# Patient Record
Sex: Male | Born: 1990 | Race: Black or African American | Hispanic: No | Marital: Married | Smoking: Current every day smoker
Health system: Southern US, Community
[De-identification: ages and names within clinical notes are randomized; demographics above are authoritative.]

## PROBLEM LIST (undated history)

## (undated) DIAGNOSIS — J45909 Unspecified asthma, uncomplicated: Secondary | ICD-10-CM

## (undated) HISTORY — PX: LEG SURGERY: SHX1003

---
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
## 2011-08-04 IMAGING — CR DG CHEST 2V
2 series · 2 of 2 positions shown · non-contrast
Comparison: 11/16/2009

CLINICAL DATA: Chest pain

CHEST - 2 VIEW

[w chest pa]
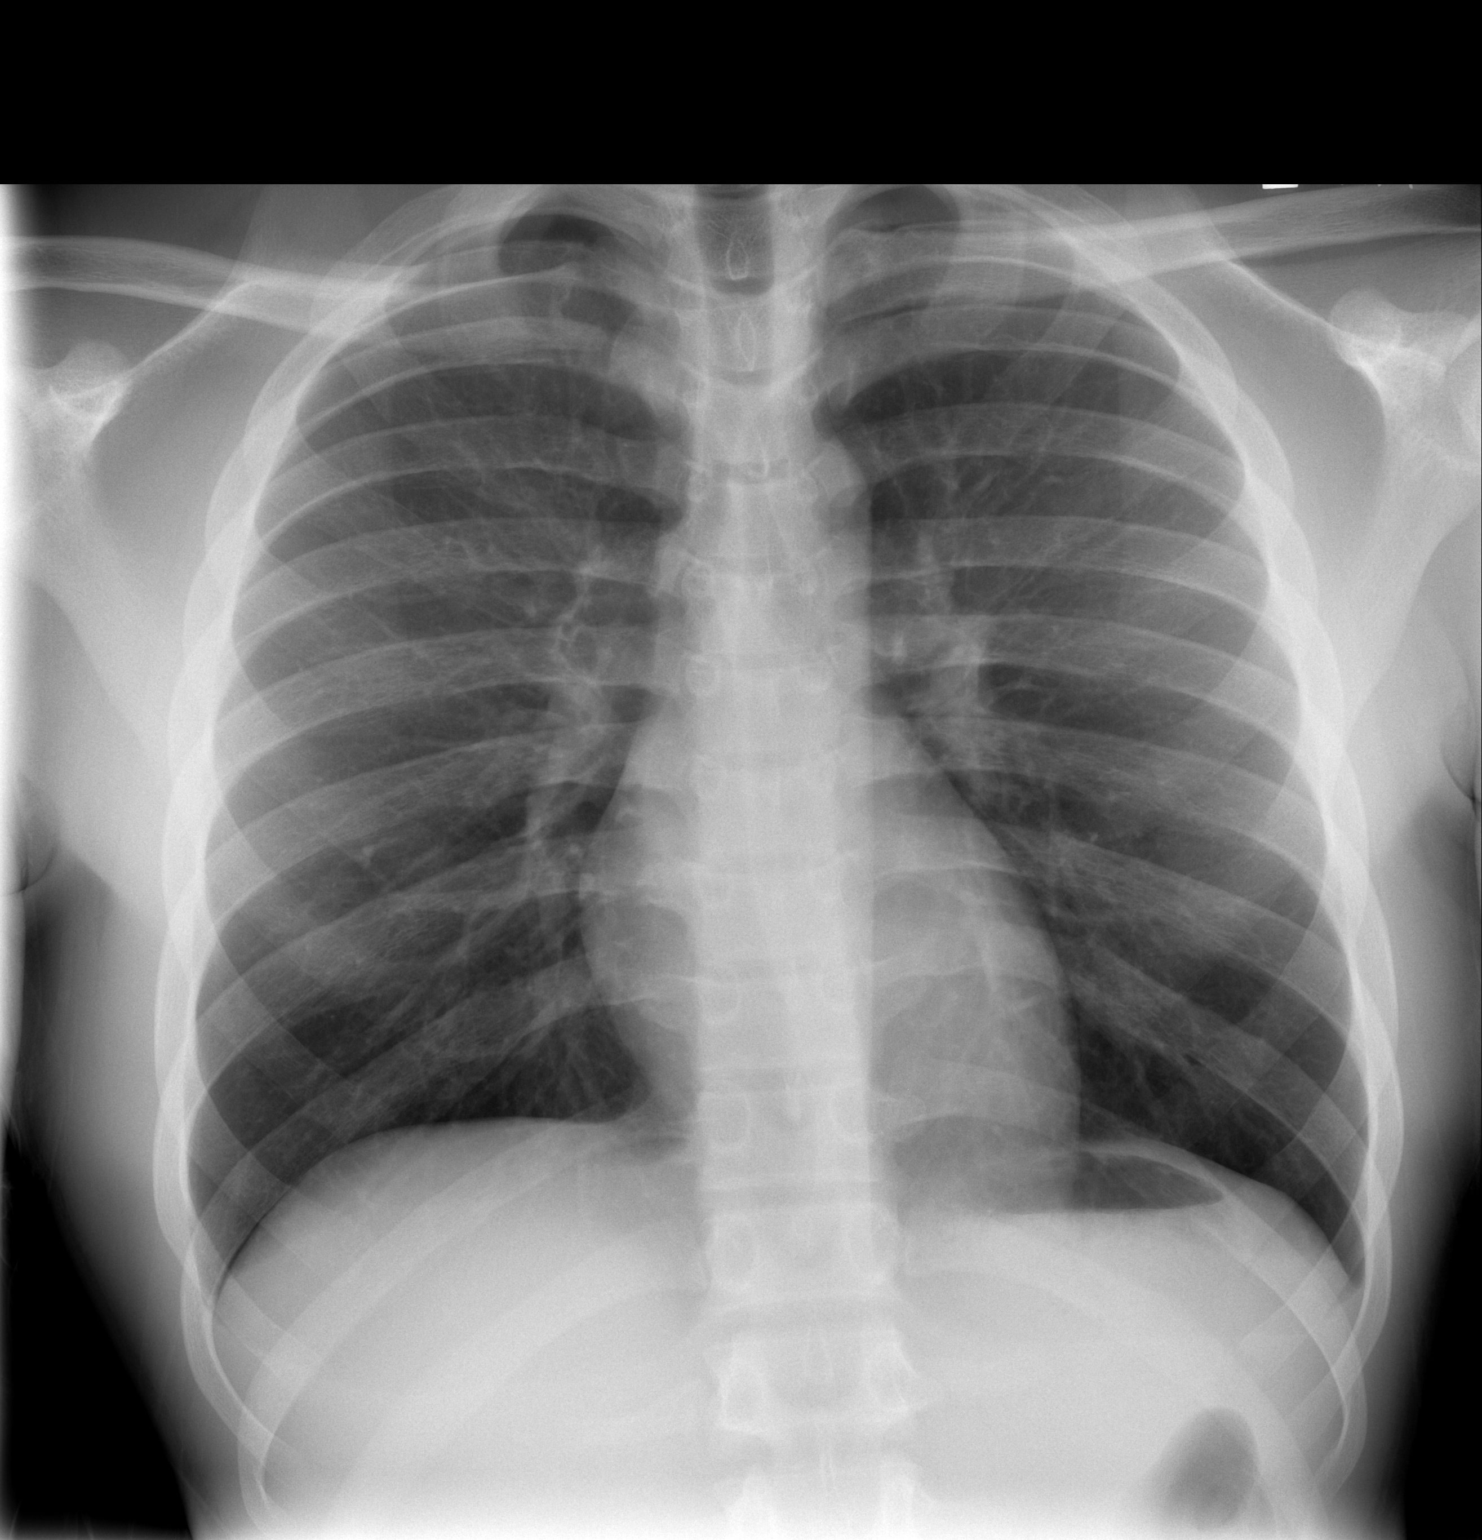

[w chest lat]
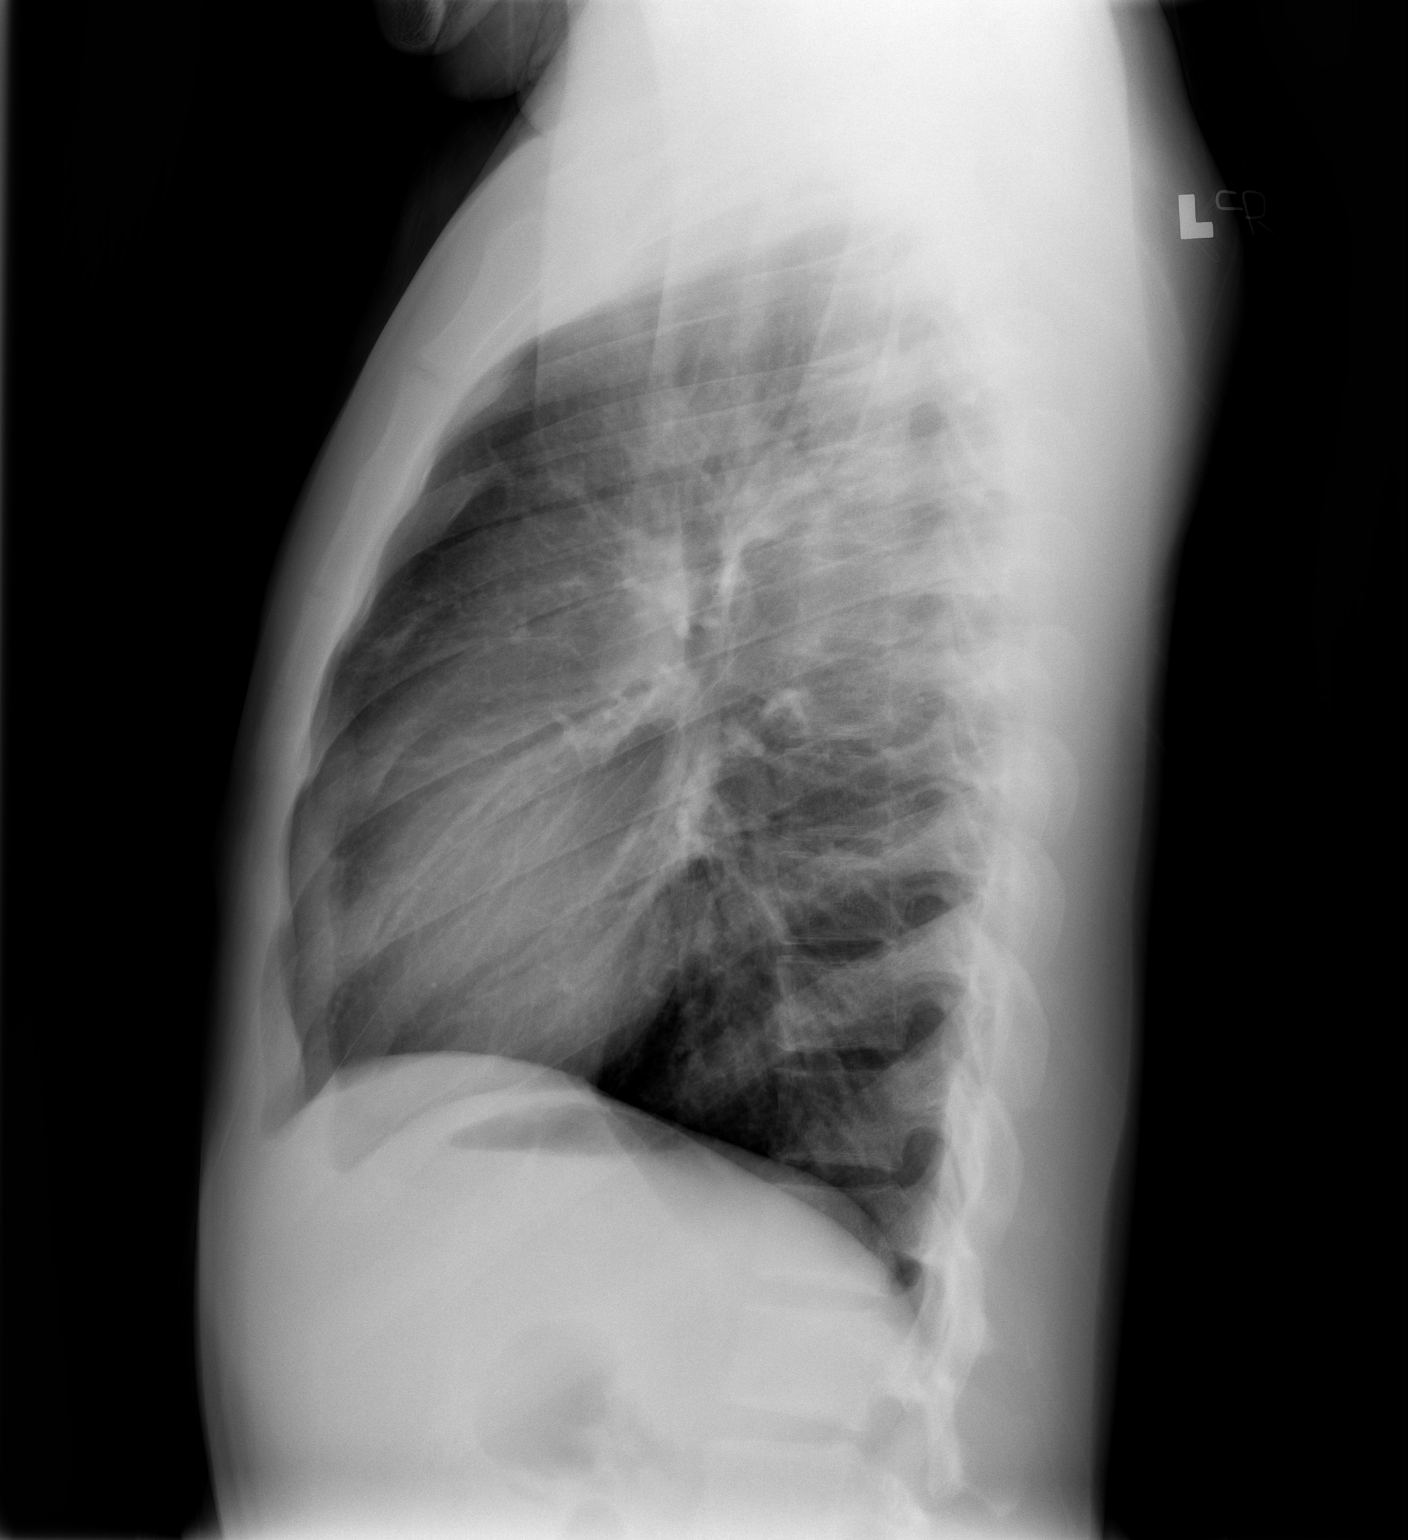

[2 of 2 positions shown; findings below may reference images not displayed]

FINDINGS: Normal heart size, mediastinal contours, and pulmonary vascularity.
Minimal peribronchial thickening.
Lungs otherwise clear.
No pleural effusion or pneumothorax.
Minimal pulmonary hyperexpansion.
Osseous structures unremarkable.
IMPRESSION: Peribronchial thickening and minimally hyperexpanded lungs, which
could reflect bronchitis or reactive airway disease.
No acute infiltrate.

## 2014-11-08 ENCOUNTER — Emergency Department (HOSPITAL_COMMUNITY): Payer: Medicaid Other

## 2014-11-08 ENCOUNTER — Encounter (HOSPITAL_COMMUNITY): Payer: Self-pay

## 2014-11-08 ENCOUNTER — Emergency Department (HOSPITAL_COMMUNITY)
Admission: EM | Admit: 2014-11-08 | Discharge: 2014-11-08 | Disposition: A | Discharge status: Home / Self Care | Payer: Medicaid Other | Attending: Emergency Medicine | Admitting: Emergency Medicine

## 2014-11-08 DIAGNOSIS — Y9289 Other specified places as the place of occurrence of the external cause: Secondary | ICD-10-CM | POA: Insufficient documentation

## 2014-11-08 DIAGNOSIS — Y998 Other external cause status: Secondary | ICD-10-CM | POA: Insufficient documentation

## 2014-11-08 DIAGNOSIS — Z720 Tobacco use: Secondary | ICD-10-CM | POA: Insufficient documentation

## 2014-11-08 DIAGNOSIS — Y9389 Activity, other specified: Secondary | ICD-10-CM | POA: Insufficient documentation

## 2014-11-08 DIAGNOSIS — S60221A Contusion of right hand, initial encounter: Secondary | ICD-10-CM | POA: Insufficient documentation

## 2014-11-08 MED ORDER — IBUPROFEN 600 MG PO TABS: 600.0000 mg | ORAL_TABLET | Freq: Four times a day (QID) | ORAL | Status: DC | PRN
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
## 2014-11-08 NOTE — ED Notes (Signed)
Pt declining pain medication at this time.

## 2014-11-08 NOTE — ED Notes (Signed)
Pt given ice for hand.  

## 2014-11-08 NOTE — Discharge Instructions (Signed)
Please take the motrin every 6 hours to keep the swelling down. Use RICE treatment as below. No fracture seen on the Xays, but you have pain by the base of the thumb and so seeing hand doctor is mandatory.   Contusion A contusion is a deep bruise. Contusions are the result of an injury that caused bleeding under the skin. The contusion may turn blue, purple, or yellow. Minor injuries will give you a painless contusion, but more severe contusions may stay painful and swollen for a few weeks.  CAUSES  A contusion is usually caused by a blow, trauma, or direct force to an area of the body. SYMPTOMS   Swelling and redness of the injured area.  Bruising of the injured area.  Tenderness and soreness of the injured area.  Pain. DIAGNOSIS  The diagnosis can be made by taking a history and physical exam. An X-ray, CT scan, or MRI may be needed to determine if there were any associated injuries, such as fractures. TREATMENT  Specific treatment will depend on what area of the body was injured. In general, the best treatment for a contusion is resting, icing, elevating, and applying cold compresses to the injured area. Over-the-counter medicines may also be recommended for pain control. Ask your caregiver what the best treatment is for your contusion. HOME CARE INSTRUCTIONS   Put ice on the injured area.  Put ice in a plastic bag.  Place a towel between your skin and the bag.  Leave the ice on for 15-20 minutes, 3-4 times a day, or as directed by your health care provider.  Only take over-the-counter or prescription medicines for pain, discomfort, or fever as directed by your caregiver. Your caregiver may recommend avoiding anti-inflammatory medicines (aspirin, ibuprofen, and naproxen) for 48 hours because these medicines may increase bruising.  Rest the injured area.  If possible, elevate the injured area to reduce swelling. SEEK IMMEDIATE MEDICAL CARE IF:   You have increased bruising or  swelling.  You have pain that is getting worse.  Your swelling or pain is not relieved with medicines. MAKE SURE YOU:   Understand these instructions.  Will watch your condition.  Will get help right away if you are not doing well or get worse. Document Released: 01/14/2005 Document Revised: 04/11/2013 Document Reviewed: 02/09/2011 [facility] Patient Information 2015 [place], [place]. This information is not intended to replace advice given to you by your health care provider. Make sure you discuss any questions you have with your health care provider. RICE: Routine Care for Injuries The routine care of many injuries includes Rest, Ice, Compression, and Elevation (RICE). HOME CARE INSTRUCTIONS  Rest is needed to allow your body to heal. Routine activities can usually be resumed when comfortable. Injured tendons and bones can take up to 6 weeks to heal. Tendons are the cord-like structures that attach muscle to bone.  Ice following an injury helps keep the swelling down and reduces pain.  Put ice in a plastic bag.  Place a towel between your skin and the bag.  Leave the ice on for 15-20 minutes, 3-4 times a day, or as directed by your health care provider. Do this while awake, for the first 24 to 48 hours. After that, continue as directed by your caregiver.  Compression helps keep swelling down. It also gives support and helps with discomfort. If an elastic bandage has been applied, it should be removed and reapplied every 3 to 4 hours. It should not be applied tightly, but firmly  enough to keep swelling down. Watch fingers or toes for swelling, bluish discoloration, coldness, numbness, or excessive pain. If any of these problems occur, remove the bandage and reapply loosely. Contact your caregiver if these problems continue.  Elevation helps reduce swelling and decreases pain. With extremities, such as the arms, hands, legs, and feet, the injured area should be placed near or above the  level of the heart, if possible. SEEK IMMEDIATE MEDICAL CARE IF:  You have persistent pain and swelling.  You develop redness, numbness, or unexpected weakness.  Your symptoms are getting worse rather than improving after several days. These symptoms may indicate that further evaluation or further X-rays are needed. Sometimes, X-rays may not show a small broken bone (fracture) until 1 week or 10 days later. Make a follow-up appointment with your caregiver. Ask when your X-ray results will be ready. Make sure you get your X-ray results. Document Released: 07/19/2000 Document Revised: 04/11/2013 Document Reviewed: 09/05/2010 [facility] Patient Information 2015 [place], [place]. This information is not intended to replace advice given to you by your health care provider. Make sure you discuss any questions you have with your health care provider. Scaphoid Fracture, Wrist A fracture is a break in the bone. The bone you have broken often does not show up as a fracture on x-ray until later on in the healing phase. This bone is called the scaphoid bone. With this bone, your caregiver will often cast or splint your wrist as though it is fractured, even if a fracture is not seen on the x-ray. This is often done with wrist injuries in which there is tenderness at the base of the thumb. An x-ray at 1-3 weeks after your injury may confirm this fracture. A cast or splint is used to protect and keep your injured bone in good position for healing. The cast or splint will be on generally for about 6 to 16 weeks, depending on your health, age, the fracture location and how quickly you heal. Another name for the scaphoid bone is the navicular bone. HOME CARE INSTRUCTIONS   To lessen the swelling and pain, keep the injured part elevated above your heart while sitting or lying down.  Apply ice to the injury for 15-20 minutes, 03-04 times per day while awake, for 2 days. Put the ice in a plastic bag and place a thin towel  between the bag of ice and your cast.  If you have a plaster or fiberglass cast or splint:  Do not try to scratch the skin under the cast using sharp or pointed objects.  Check the skin around the cast every day. You may put lotion on any red or sore areas.  Keep your cast or splint dry and clean.  If you have a plaster splint:  Wear the splint as directed.  You may loosen the elastic bandage around the splint if your fingers become numb, tingle, or turn cold or blue.  If you have been put in a removable splint, wear and use as directed.  Do not put pressure on any part of your cast or splint; it may deform or break. Rest your cast or splint only on a pillow the first 24 hours until it is fully hardened.  Your cast or splint can be protected during bathing with a plastic bag. Do not lower the cast or splint into water.  Only take over-the-counter or prescription medicines for pain, discomfort, or fever as directed by your caregiver.  If your caregiver has given  you a follow up appointment, it is very important to keep that appointment. Not keeping the appointment could result in chronic pain and decreased function. If there is any problem keeping the appointment, you must call back to this facility for assistance. SEEK IMMEDIATE MEDICAL CARE IF:   Your cast gets damaged, wet or breaks.  You have continued severe pain or more swelling than you did before the cast or splint was put on.  Your skin or nails below the injury turn blue or gray, or feel cold or numb.  You have tingling or burning pain in your fingers or increasing pain with movement of your fingers Document Released: 03/27/2002 Document Revised: 06/29/2011 Document Reviewed: 11/23/2008 [facility] Patient Information 2015 [place], [place]. This information is not intended to replace advice given to you by your health care provider. Make sure you discuss any questions you have with your health care provider.

## 2014-12-20 ENCOUNTER — Emergency Department (HOSPITAL_COMMUNITY): Payer: No Typology Code available for payment source

## 2014-12-20 ENCOUNTER — Encounter (HOSPITAL_COMMUNITY): Payer: Self-pay | Admitting: *Deleted

## 2014-12-20 ENCOUNTER — Emergency Department (HOSPITAL_COMMUNITY)
Admission: EM | Admit: 2014-12-20 | Discharge: 2014-12-20 | Disposition: A | Discharge status: Home / Self Care | Payer: No Typology Code available for payment source | Attending: Emergency Medicine | Admitting: Emergency Medicine

## 2014-12-20 DIAGNOSIS — Y92410 Unspecified street and highway as the place of occurrence of the external cause: Secondary | ICD-10-CM | POA: Insufficient documentation

## 2014-12-20 DIAGNOSIS — S80219A Abrasion, unspecified knee, initial encounter: Secondary | ICD-10-CM | POA: Diagnosis not present

## 2014-12-20 DIAGNOSIS — S70219A Abrasion, unspecified hip, initial encounter: Secondary | ICD-10-CM | POA: Insufficient documentation

## 2014-12-20 DIAGNOSIS — S0081XA Abrasion of other part of head, initial encounter: Secondary | ICD-10-CM | POA: Insufficient documentation

## 2014-12-20 DIAGNOSIS — S199XXA Unspecified injury of neck, initial encounter: Secondary | ICD-10-CM | POA: Diagnosis not present

## 2014-12-20 DIAGNOSIS — T1490XA Injury, unspecified, initial encounter: Secondary | ICD-10-CM

## 2014-12-20 DIAGNOSIS — Y9389 Activity, other specified: Secondary | ICD-10-CM | POA: Insufficient documentation

## 2014-12-20 DIAGNOSIS — Z720 Tobacco use: Secondary | ICD-10-CM | POA: Diagnosis not present

## 2014-12-20 DIAGNOSIS — Y998 Other external cause status: Secondary | ICD-10-CM | POA: Diagnosis not present

## 2014-12-20 DIAGNOSIS — T148XXA Other injury of unspecified body region, initial encounter: Secondary | ICD-10-CM

## 2014-12-20 LAB — COMPREHENSIVE METABOLIC PANEL
ALBUMIN: 3.1 g/dL — AB (ref 3.5–5.0)
ALK PHOS: 51 U/L (ref 38–126)
ALT: 23 U/L (ref 17–63)
AST: 35 U/L (ref 15–41)
Anion gap: 7 (ref 5–15)
BILIRUBIN TOTAL: 0.6 mg/dL (ref 0.3–1.2)
BUN: 8 mg/dL (ref 6–20)
CALCIUM: 8.4 mg/dL — AB (ref 8.9–10.3)
CO2: 26 mmol/L (ref 22–32)
Chloride: 105 mmol/L (ref 101–111)
Creatinine, Ser: 1.03 mg/dL (ref 0.61–1.24)
GFR calc Af Amer: >60 mL/min (ref >60)
GFR calc non Af Amer: >60 mL/min (ref >60)
GLUCOSE: 98 mg/dL (ref 65–99)
POTASSIUM: 3.8 mmol/L (ref 3.5–5.1)
Sodium: 138 mmol/L (ref 135–145)
TOTAL PROTEIN: 5.2 g/dL — AB (ref 6.5–8.1)

## 2014-12-20 LAB — CBC WITH DIFFERENTIAL/PLATELET
BASOS ABS: 0 10*3/uL (ref 0.0–0.1)
BASOS PCT: 0 % (ref 0–1)
Eosinophils Absolute: 0 10*3/uL (ref 0.0–0.7)
Eosinophils Relative: 1 % (ref 0–5)
HEMATOCRIT: 43.2 % (ref 39.0–52.0)
HEMOGLOBIN: 13.9 g/dL (ref 13.0–17.0)
Lymphocytes Relative: 48 % — ABNORMAL HIGH (ref 12–46)
Lymphs Abs: 3.3 10*3/uL (ref 0.7–4.0)
MCH: 28.1 pg (ref 26.0–34.0)
MCHC: 32.2 g/dL (ref 30.0–36.0)
MCV: 87.3 fL (ref 78.0–100.0)
Monocytes Absolute: 0.6 10*3/uL (ref 0.1–1.0)
Monocytes Relative: 9 % (ref 3–12)
NEUTROS ABS: 2.9 10*3/uL (ref 1.7–7.7)
NEUTROS PCT: 42 % — AB (ref 43–77)
Platelets: 171 10*3/uL (ref 150–400)
RBC: 4.95 MIL/uL (ref 4.22–5.81)
RDW: 13.5 % (ref 11.5–15.5)
WBC: 7 10*3/uL (ref 4.0–10.5)

## 2014-12-20 MED ORDER — OXYCODONE-ACETAMINOPHEN 5-325 MG PO TABS
1.0000 | ORAL_TABLET | Freq: Once | ORAL | Status: AC
  Administered 2014-12-20: 1 via ORAL
  Filled 2014-12-20: qty 1
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
## 2014-12-20 NOTE — ED Notes (Signed)
Patient is alert and orientedx4.  Patient was explained discharge instructions and they understood them with no questions.  The patient's wife, Joni Reining is taking the patient home.
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
## 2014-12-20 NOTE — Discharge Instructions (Signed)

## 2014-12-21 ENCOUNTER — Encounter (HOSPITAL_COMMUNITY): Payer: Self-pay

## 2016-03-20 ENCOUNTER — Encounter (HOSPITAL_COMMUNITY): Payer: Self-pay | Admitting: Emergency Medicine

## 2016-03-20 ENCOUNTER — Emergency Department (HOSPITAL_COMMUNITY)
Admission: EM | Admit: 2016-03-20 | Discharge: 2016-03-20 | Disposition: A | Discharge status: Home / Self Care | Payer: Medicaid Other | Attending: Emergency Medicine | Admitting: Emergency Medicine

## 2016-03-20 DIAGNOSIS — Y929 Unspecified place or not applicable: Secondary | ICD-10-CM | POA: Insufficient documentation

## 2016-03-20 DIAGNOSIS — S01511A Laceration without foreign body of lip, initial encounter: Secondary | ICD-10-CM

## 2016-03-20 DIAGNOSIS — Y999 Unspecified external cause status: Secondary | ICD-10-CM | POA: Insufficient documentation

## 2016-03-20 DIAGNOSIS — Y939 Activity, unspecified: Secondary | ICD-10-CM | POA: Insufficient documentation

## 2016-03-20 DIAGNOSIS — F17200 Nicotine dependence, unspecified, uncomplicated: Secondary | ICD-10-CM | POA: Insufficient documentation

## 2016-03-20 DIAGNOSIS — Z23 Encounter for immunization: Secondary | ICD-10-CM | POA: Insufficient documentation

## 2016-03-20 MED ORDER — LIDOCAINE HCL (PF) 1 % IJ SOLN
5.0000 mL | Freq: Once | INTRAMUSCULAR | Status: AC
  Administered 2016-03-20: 5 mL via INTRADERMAL
  Filled 2016-03-20: qty 5

## 2016-03-20 MED ORDER — TETANUS-DIPHTH-ACELL PERTUSSIS 5-2.5-18.5 LF-MCG/0.5 IM SUSP
0.5000 mL | Freq: Once | INTRAMUSCULAR | Status: AC
  Administered 2016-03-20: 0.5 mL via INTRAMUSCULAR
  Filled 2016-03-20: qty 0.5

## 2016-03-20 MED ORDER — LIDOCAINE HCL (PF) 1 % IJ SOLN: 5.0000 mL | Freq: Once | INTRAMUSCULAR | Status: DC
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
## 2016-03-20 NOTE — Discharge Instructions (Signed)
WOUND CARE   Continue daily cleansing with soap and water until stitches/staples dissolve.  Do not apply any ointments or creams to the wound while stitches/staples are in place, as this may cause the stitches to dissolve early  delayed healing.  Notify the office if you experience any of the following signs of infection: Swelling, redness, pus drainage, streaking, fever >101.0 F  Notify the office if you experience excessive bleeding that does not stop after 15-20 minutes of constant, firm pressure.       Get help right away if: Your face or the area under your jaw becomes swollen. You have trouble breathing or swallowing. Contact a health care provider if: You were given a tetanus shot and have swelling, severe pain, redness, or bleeding at the injection site. You have a fever. Your pain is not controlled with medicine. You have redness, swelling, or pain at your wound that is getting worse. You have fresh bleeding or pus coming from your wound. The edges of your wound break open. You develop swollen, tender glands in your throat. Get help right away if: Your face or the area under your jaw becomes swollen. You have trouble breathing or swallowing.

## 2016-04-17 ENCOUNTER — Encounter (HOSPITAL_COMMUNITY): Payer: Self-pay | Admitting: Emergency Medicine

## 2016-04-17 ENCOUNTER — Emergency Department (HOSPITAL_COMMUNITY)
Admission: EM | Admit: 2016-04-17 | Discharge: 2016-04-17 | Disposition: A | Discharge status: Home / Self Care | Payer: Medicaid Other | Attending: Emergency Medicine | Admitting: Emergency Medicine

## 2016-04-17 ENCOUNTER — Emergency Department (HOSPITAL_COMMUNITY): Payer: Medicaid Other

## 2016-04-17 DIAGNOSIS — J45909 Unspecified asthma, uncomplicated: Secondary | ICD-10-CM | POA: Insufficient documentation

## 2016-04-17 DIAGNOSIS — F17210 Nicotine dependence, cigarettes, uncomplicated: Secondary | ICD-10-CM | POA: Insufficient documentation

## 2016-04-17 DIAGNOSIS — R0789 Other chest pain: Secondary | ICD-10-CM | POA: Insufficient documentation

## 2016-04-17 HISTORY — DX: Unspecified asthma, uncomplicated: J45.909

## 2016-04-17 LAB — CBC
HEMATOCRIT: 43.3 % (ref 39.0–52.0)
Hemoglobin: 14.6 g/dL (ref 13.0–17.0)
MCH: 28.3 pg (ref 26.0–34.0)
MCHC: 33.7 g/dL (ref 30.0–36.0)
MCV: 84.1 fL (ref 78.0–100.0)
PLATELETS: 263 10*3/uL (ref 150–400)
RBC: 5.15 MIL/uL (ref 4.22–5.81)
RDW: 12.2 % (ref 11.5–15.5)
WBC: 5.1 10*3/uL (ref 4.0–10.5)

## 2016-04-17 LAB — BASIC METABOLIC PANEL
Anion gap: 8 (ref 5–15)
BUN: 8 mg/dL (ref 6–20)
CO2: 23 mmol/L (ref 22–32)
CREATININE: 0.81 mg/dL (ref 0.61–1.24)
Calcium: 9 mg/dL (ref 8.9–10.3)
Chloride: 106 mmol/L (ref 101–111)
GFR calc Af Amer: >60 mL/min (ref >60)
GLUCOSE: 104 mg/dL — AB (ref 65–99)
POTASSIUM: 3.6 mmol/L (ref 3.5–5.1)
Sodium: 137 mmol/L (ref 135–145)

## 2016-04-17 LAB — I-STAT TROPONIN, ED: Troponin i, poc: 0.01 ng/mL (ref 0.00–0.08)

## 2016-04-17 MED ORDER — IBUPROFEN 800 MG PO TABS
800.0000 mg | ORAL_TABLET | Freq: Once | ORAL | Status: AC
  Administered 2016-04-17: 800 mg via ORAL
  Filled 2016-04-17: qty 1

## 2016-04-17 MED ORDER — IBUPROFEN 800 MG PO TABS: 800.0000 mg | ORAL_TABLET | Freq: Three times a day (TID) | ORAL | 0 refills | Status: DC | PRN

## 2016-04-17 NOTE — ED Provider Notes (Signed)
TIME SEEN: 6:00 AM  CHIEF COMPLAINT: Chest pain  HPI: Pt is a 25 y.o. male with history of asthma who presents emergency department with several weeks of central chest pain and shortness of breath. Pain worse with movement, palpation. Patient reports the pain is also worse when the weather changes. He describes the pain as a squeezing pain without radiation. Mild to moderate in nature. Does report associated shortness of breath. No fevers, chills, cough. No vomiting or diarrhea. No family history of premature CAD.  No history of PE, DVT, exogenous estrogen use, recent fractures, surgery, trauma, hospitalization or prolonged travel. No lower extremity swelling or pain. No calf tenderness. Has not tried anything at home for his pain.   ROS: See HPI Constitutional: no fever  Eyes: no drainage  ENT: no runny nose   Cardiovascular:   chest pain  Resp:  SOB  GI: no vomiting GU: no dysuria Integumentary: no rash  Allergy: no hives  Musculoskeletal: no leg swelling  Neurological: no slurred speech ROS otherwise negative  PAST MEDICAL HISTORY/PAST SURGICAL HISTORY:  Past Medical History:  Diagnosis Date  . Asthma     MEDICATIONS:  Prior to Admission medications   Medication Sig Start Date End Date Taking? Authorizing Provider  ibuprofen (ADVIL,MOTRIN) 800 MG tablet Take 1 tablet (800 mg total) by mouth every 8 (eight) hours as needed for mild pain. 04/17/16   Aryani Daffern N Ela Moffat, DO    ALLERGIES:  No Known Allergies  SOCIAL HISTORY:  Social History  Substance Use Topics  . Smoking status: Current Every Day Smoker  . Smokeless tobacco: Never Used  . Alcohol use No    FAMILY HISTORY: No family history on file.  EXAM: BP 121/64   Pulse (!) 48   Temp 97.3 F (36.3 C) (Oral)   Resp 18   Ht 5\' 9"  (1.753 m)   Wt 181 lb 1 oz (82.1 kg)   SpO2 99%   BMI 26.74 kg/m  CONSTITUTIONAL: Alert and oriented and responds appropriately to questions. Well-appearing; well-nourished, Patient  resting comfortably, smells very strongly of marijuana HEAD: Normocephalic EYES: Conjunctivae clear, PERRL, EOMI ENT: normal nose; no rhinorrhea; moist mucous membranes NECK: Supple, no meningismus, no nuchal rigidity, no LAD  CARD: RRR; S1 and S2 appreciated; no murmurs, no clicks, no rubs, no gallops CHEST:  Central anterior Chest wall is tender to palpation which reproduces his pain, no crepitus, ecchymosis, deformity, file chest. No rash or other lesions. RESP: Normal chest excursion without splinting or tachypnea; breath sounds clear and equal bilaterally; no wheezes, no rhonchi, no rales, no hypoxia or respiratory distress, speaking full sentences ABD/GI: Normal bowel sounds; non-distended; soft, non-tender, no rebound, no guarding, no peritoneal signs, no hepatosplenomegaly BACK:  The back appears normal and is non-tender to palpation, there is no CVA tenderness EXT: Normal ROM in all joints; non-tender to palpation; no edema; normal capillary refill; no cyanosis, no calf tenderness or swelling    SKIN: Normal color for age and race; warm; no rash NEURO: Moves all extremities equally, sensation to light touch intact diffusely, cranial nerves II through XII intact, normal speech PSYCH: The patient's mood and manner are appropriate. Grooming and personal hygiene are appropriate.  MEDICAL DECISION MAKING: Patient here with atypical chest pain. He has no risk factors for pulmonary embolus and is PERC negative. Pain is been present for several weeks. Troponin ordered in triage is negative. EKG shows no ischemic abnormality, arrhythmia or interval abnormality. Chest x-ray is clear. No edema, pneumothorax  or pneumonia. I suspect that this is musculoskeletal in nature. Pain reproducible with palpation. Have advised him to use ibuprofen for pain. We'll give him outpatient PCP follow-up. Discussed return precautions. He is comfortable with this plan.   At this time, I do not feel there is any  life-threatening condition present. I have reviewed and discussed all results (EKG, imaging, lab, urine as appropriate) and exam findings with patient/family. I have reviewed nursing notes and appropriate previous records.  I feel the patient is safe to be discharged home without further emergent workup and can continue workup as an outpatient as needed. Discussed usual and customary return precautions. Patient/family verbalize understanding and are comfortable with this plan.  Outpatient follow-up has been provided. All questions have been answered.    EKG Interpretation  Date/Time:  Friday April 17 2016 02:36:30 EST Ventricular Rate:  49 PR Interval:  170 QRS Duration: 102 QT Interval:  402 QTC Calculation: 363 R Axis:   81 Text Interpretation:  Sinus bradycardia Early repolarization Otherwise normal ECG No significant change since last tracing in 2012 Confirmed by Sueko Dimichele,  DO, Alante Tolan [phone]([id number]) on 04/17/2016 5:06:08 AM          Layla MawKristen N Chevie Birkhead, DO 04/17/16 [id number]

## 2016-04-17 NOTE — ED Notes (Signed)
Pt states he has been coughing and suffering from a cold for "i don't know how long" and his chest hurts when he coughs.  No fevers that he knows of.  Has tried Nyquil at home with no relief.

## 2016-04-17 NOTE — Discharge Instructions (Signed)
To find a primary care or specialty doctor please call [phone] or [phone] to access "[facility] Find a Doctor Service." ° °You may also go on the [facility] website at [email or web address] ° °There are also multiple [facility], [facility] and [facility] practices throughout the [place] that are frequently accepting new patients. You may find a clinic that is close to your home and contact them. ° °[facility] -  °[address] °[place] [address] °[phone] ° ° °[facility] -  °[address] ° [place] [address] [phone] ° ° °[facility] - °[address]  °[place] [address] °[phone] ° ° °
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
## 2016-05-10 ENCOUNTER — Encounter (HOSPITAL_COMMUNITY): Payer: Self-pay | Admitting: Emergency Medicine

## 2016-05-10 ENCOUNTER — Emergency Department (HOSPITAL_COMMUNITY)
Admission: EM | Admit: 2016-05-10 | Discharge: 2016-05-10 | Disposition: A | Discharge status: Home / Self Care | Payer: Medicaid Other | Attending: Emergency Medicine | Admitting: Emergency Medicine

## 2016-05-10 DIAGNOSIS — J45909 Unspecified asthma, uncomplicated: Secondary | ICD-10-CM | POA: Insufficient documentation

## 2016-05-10 DIAGNOSIS — R0789 Other chest pain: Secondary | ICD-10-CM

## 2016-05-10 DIAGNOSIS — R072 Precordial pain: Secondary | ICD-10-CM | POA: Insufficient documentation

## 2016-05-10 DIAGNOSIS — F17200 Nicotine dependence, unspecified, uncomplicated: Secondary | ICD-10-CM | POA: Insufficient documentation

## 2016-05-10 MED ORDER — DICLOFENAC SODIUM 1 % TD GEL: 2.0000 g | Freq: Four times a day (QID) | TRANSDERMAL | 0 refills | Status: AC | PRN
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
## 2016-05-10 NOTE — ED Notes (Signed)
Pt departed in NAD, refused use of wheelchair.  

## 2016-05-10 NOTE — ED Provider Notes (Signed)
[facility]PT Provider Note   CSN: [id number] Arrival date & time: 05/10/16  0200     History   Chief Complaint Chief Complaint  Patient presents with  . Chest pain  . Dental Pain    HPI Stephen Golden is a 26 y.o. male.  HPI   Stephen Golden is a 26 y.o. male, with a history of asthma, presenting to the ED with chest discomfort for the past 4 months. Pain is sharp, central chest, nonradiating. Pain has not changed. Has taken occasional ibuprofen with some relief. Patient has been evaluated multiple times for this issue.  Denies cough, shortness of breath, fever/chills, or any other complaints.  Past Medical History:  Diagnosis Date  . Asthma     There are no active problems to display for this patient.   Past Surgical History:  Procedure Laterality Date  . LEG SURGERY Bilateral    26 years old       Home Medications    Prior to Admission medications   Medication Sig Start Date End Date Taking? Authorizing Provider  diclofenac sodium (VOLTAREN) 1 % GEL Apply 2 g topically 4 (four) times daily as needed (for pain). 05/10/16   Jamaiyah Pyle C Nalani Andreen, PA-C  ibuprofen (ADVIL,MOTRIN) 800 MG tablet Take 1 tablet (800 mg total) by mouth every 8 (eight) hours as needed for mild pain. 04/17/16   Layla Maw Ward, DO    Family History No family history on file.  Social History Social History  Substance Use Topics  . Smoking status: Current Every Day Smoker  . Smokeless tobacco: Never Used  . Alcohol use No     Allergies   Patient has no known allergies.   Review of Systems Review of Systems  Constitutional: Negative for chills and fever.  Respiratory: Negative for cough and shortness of breath.   Gastrointestinal: Negative for nausea and vomiting.  Musculoskeletal:       Chest wall tenderness     Physical Exam Updated Vital Signs BP 116/66   Pulse 62   Temp 97.5 F (36.4 C) (Oral)   Resp 22   Ht 5\' 10"  (1.778 m)   Wt 81.6 kg   SpO2 98%   BMI 25.83  kg/m   Physical Exam  Constitutional: He appears well-developed and well-nourished. No distress.  Patient is sleeping upon my initial evaluation. He appeared to be rather uninterested in the conversation.  HENT:  Head: Normocephalic and atraumatic.  Eyes: Conjunctivae are normal.  Neck: Normal range of motion. Neck supple.  Cardiovascular: Normal rate, regular rhythm, normal heart sounds and intact distal pulses.   Pulmonary/Chest: Effort normal and breath sounds normal. No respiratory distress. He exhibits tenderness (over the sternum).  No increased work of breathing. Patient is sleeping comfortably on the bed prior to my exam.   Abdominal: There is no guarding.  Musculoskeletal: He exhibits tenderness. He exhibits no edema.  Tenderness over the sternum without deformity, crepitus, swelling, or other abnormality noted.   Lymphadenopathy:    He has no cervical adenopathy.  Neurological: He is alert.  Skin: Skin is warm and dry. He is not diaphoretic.  Psychiatric: He has a normal mood and affect. His behavior is normal.  Nursing note and vitals reviewed.    ED Treatments / Results  Labs (all labs ordered are listed, but only abnormal results are displayed) Labs Reviewed - No data to display  EKG  EKG Interpretation None       Radiology No results found.  Procedures Procedures (including critical care time)  Medications Ordered in ED Medications - No data to display   Initial Impression / Assessment and Plan / ED Course  I have reviewed the triage vital signs and the nursing notes.  Pertinent labs & imaging results that were available during my care of the patient were reviewed by me and considered in my medical decision making (see chart for details).     Patient presents for repeat evaluation of sternal tenderness. No abnormalities other than tenderness, exam. Vital signs are normal. PCP follow-up. Resources given. Home care and return precautions  discussed.  Vitals:   05/10/16 0430 05/10/16 0445 05/10/16 0500 05/10/16 0515  BP: 116/66 105/59 109/57 103/72  Pulse: 62 67 84 72  Resp:      Temp:      TempSrc:      SpO2: 98% 97% 99% 97%  Weight:      Height:          Final Clinical Impressions(s) / ED Diagnoses   Final diagnoses:  Chest wall tenderness    New Prescriptions New Prescriptions   DICLOFENAC SODIUM (VOLTAREN) 1 % GEL    Apply 2 g topically 4 (four) times daily as needed (for pain).     Anselm PancoastShawn C Nance Mccombs, PA-C 05/10/16 [id number]    Tomasita CrumbleAdeleke Oni, MD 05/10/16 [phone]

## 2016-05-10 NOTE — Discharge Instructions (Signed)
There were no abnormalities noted today other than tenderness. Follow up on this matter with a primary care provider. Go to a primary care provider for any further incidences of this issue. Instead of ibuprofen or naproxen, you may try applying the diclofenac gel to the sternum. Do not use this gel for pain in the mouth.

## 2016-05-26 ENCOUNTER — Emergency Department (HOSPITAL_COMMUNITY)
Admission: EM | Admit: 2016-05-26 | Discharge: 2016-05-26 | Disposition: A | Discharge status: Home / Self Care | Payer: Self-pay | Attending: Emergency Medicine | Admitting: Emergency Medicine

## 2016-05-26 ENCOUNTER — Encounter (HOSPITAL_COMMUNITY): Payer: Self-pay | Admitting: Emergency Medicine

## 2016-05-26 ENCOUNTER — Emergency Department (HOSPITAL_COMMUNITY): Payer: Self-pay

## 2016-05-26 DIAGNOSIS — J111 Influenza due to unidentified influenza virus with other respiratory manifestations: Secondary | ICD-10-CM | POA: Insufficient documentation

## 2016-05-26 DIAGNOSIS — Z79899 Other long term (current) drug therapy: Secondary | ICD-10-CM | POA: Insufficient documentation

## 2016-05-26 DIAGNOSIS — F17200 Nicotine dependence, unspecified, uncomplicated: Secondary | ICD-10-CM | POA: Insufficient documentation

## 2016-05-26 DIAGNOSIS — J45909 Unspecified asthma, uncomplicated: Secondary | ICD-10-CM | POA: Insufficient documentation

## 2016-05-26 LAB — CBC WITH DIFFERENTIAL/PLATELET
BASOS PCT: 0 %
Basophils Absolute: 0 10*3/uL (ref 0.0–0.1)
Eosinophils Absolute: 0 10*3/uL (ref 0.0–0.7)
Eosinophils Relative: 0 %
HEMATOCRIT: 40.2 % (ref 39.0–52.0)
HEMOGLOBIN: 13.4 g/dL (ref 13.0–17.0)
LYMPHS ABS: 1.4 10*3/uL (ref 0.7–4.0)
Lymphocytes Relative: 12 %
MCH: 27.5 pg (ref 26.0–34.0)
MCHC: 33.3 g/dL (ref 30.0–36.0)
MCV: 82.4 fL (ref 78.0–100.0)
MONO ABS: 0.5 10*3/uL (ref 0.1–1.0)
MONOS PCT: 4 %
NEUTROS ABS: 10 10*3/uL — AB (ref 1.7–7.7)
NEUTROS PCT: 84 %
Platelets: 195 10*3/uL (ref 150–400)
RBC: 4.88 MIL/uL (ref 4.22–5.81)
RDW: 13.3 % (ref 11.5–15.5)
WBC: 11.9 10*3/uL — ABNORMAL HIGH (ref 4.0–10.5)

## 2016-05-26 LAB — COMPREHENSIVE METABOLIC PANEL
ALBUMIN: 3.2 g/dL — AB (ref 3.5–5.0)
ALK PHOS: 40 U/L (ref 38–126)
ALT: 14 U/L — AB (ref 17–63)
ANION GAP: 8 (ref 5–15)
AST: 22 U/L (ref 15–41)
BUN: 6 mg/dL (ref 6–20)
CALCIUM: 8.6 mg/dL — AB (ref 8.9–10.3)
CHLORIDE: 102 mmol/L (ref 101–111)
CO2: 23 mmol/L (ref 22–32)
Creatinine, Ser: 0.95 mg/dL (ref 0.61–1.24)
GFR calc non Af Amer: >60 mL/min (ref >60)
GLUCOSE: 95 mg/dL (ref 65–99)
Potassium: 3.8 mmol/L (ref 3.5–5.1)
SODIUM: 133 mmol/L — AB (ref 135–145)
Total Bilirubin: 0.7 mg/dL (ref 0.3–1.2)
Total Protein: 6.2 g/dL — ABNORMAL LOW (ref 6.5–8.1)

## 2016-05-26 LAB — I-STAT CG4 LACTIC ACID, ED: LACTIC ACID, VENOUS: 0.89 mmol/L (ref 0.5–1.9)

## 2016-05-26 MED ORDER — BENZONATATE 100 MG PO CAPS: 100.0000 mg | ORAL_CAPSULE | Freq: Three times a day (TID) | ORAL | 0 refills | Status: AC

## 2016-05-26 MED ORDER — NAPROXEN 500 MG PO TABS: 500.0000 mg | ORAL_TABLET | Freq: Two times a day (BID) | ORAL | 0 refills | Status: AC

## 2016-05-26 MED ORDER — ACETAMINOPHEN 325 MG PO TABS
650.0000 mg | ORAL_TABLET | Freq: Once | ORAL | Status: AC | PRN
  Administered 2016-05-26: 650 mg via ORAL
  Filled 2016-05-26: qty 2

## 2016-05-26 NOTE — ED Provider Notes (Signed)
[facility]PT Provider Note    By signing my name below, I, Earmon PhoenixJennifer Waddell, attest that this documentation has been prepared under the direction and in the presence of Linwood DibblesJon Meeya Goldin, MD. Electronically Signed: Earmon PhoenixJennifer Waddell, ED Scribe. 05/26/16. 7:18 PM.    History   Chief Complaint Chief Complaint  Patient presents with  . Influenza  . Fever    The history is provided by the patient and medical records. No language interpreter was used.    Stephen Golden is a 26 y.o. male brought in by EMS with PMHx of asthma who presents to the Emergency Department complaining of flu like symptoms that began three days ago. He reports associated fever (Tmax 103 degrees), sinus pressure, congestion, generalized body aches and cough. He has taken Ibuprofen for pain with minimal relief. He denies modifying factors. He denies nausea, vomiting, diarrhea. He denies any chronic medical conditions.   Past Medical History:  Diagnosis Date  . Asthma     There are no active problems to display for this patient.   Past Surgical History:  Procedure Laterality Date  . LEG SURGERY Bilateral    [age] years old       Home Medications    Prior to Admission medications   Medication Sig Start Date End Date Taking? Authorizing Provider  benzonatate (TESSALON) 100 MG capsule Take 1 capsule (100 mg total) by mouth every 8 (eight) hours. 05/26/16   Linwood DibblesJon Raniya Golembeski, MD  diclofenac sodium (VOLTAREN) 1 % GEL Apply 2 g topically 4 (four) times daily as needed (for pain). 05/10/16   Shawn C Joy, PA-C  ibuprofen (ADVIL,MOTRIN) 800 MG tablet Take 1 tablet (800 mg total) by mouth every 8 (eight) hours as needed for mild pain. 04/17/16   Kristen N Ward, DO  naproxen (NAPROSYN) 500 MG tablet Take 1 tablet (500 mg total) by mouth 2 (two) times daily. 05/26/16   Linwood DibblesJon Jamisen Duerson, MD    Family History No family history on file.  Social History Social History  Substance Use Topics  . Smoking status: Current Every Day Smoker    . Smokeless tobacco: Never Used  . Alcohol use No     Allergies   Patient has no known allergies.   Review of Systems Review of Systems  Constitutional: Positive for fever.  HENT: Positive for congestion and sinus pressure.   Respiratory: Positive for cough.   Musculoskeletal: Positive for myalgias.  All other systems reviewed and are negative.    Physical Exam Updated Vital Signs BP 129/70   Pulse 90   Temp 102.2 F (39 C) (Oral)   Resp 15   Ht 5\' 10"  (1.778 m)   Wt 180 lb (81.6 kg)   SpO2 100%   BMI 25.83 kg/m   Physical Exam  Constitutional: He appears well-developed and well-nourished. No distress.  HENT:  Head: Normocephalic and atraumatic.  Right Ear: External ear normal.  Left Ear: External ear normal.  Eyes: Conjunctivae are normal. Right eye exhibits no discharge. Left eye exhibits no discharge. No scleral icterus.  Neck: Neck supple. No tracheal deviation present.  Cardiovascular: Normal rate, regular rhythm and intact distal pulses.   Pulmonary/Chest: Effort normal and breath sounds normal. No stridor. No respiratory distress. He has no wheezes. He has no rales.  Abdominal: Soft. Bowel sounds are normal. He exhibits no distension. There is no tenderness. There is no rebound and no guarding.  Musculoskeletal: He exhibits no edema or tenderness.  Neurological: He is alert. He has normal strength.  No cranial nerve deficit (no facial droop, extraocular movements intact, no slurred speech) or sensory deficit. He exhibits normal muscle tone. He displays no seizure activity. Coordination normal.  Skin: Skin is warm and dry. No rash noted.  Psychiatric: He has a normal mood and affect.  Nursing note and vitals reviewed.    ED Treatments / Results   DIAGNOSTIC STUDIES: Oxygen Saturation is 100% on RA, normal by my interpretation.   COORDINATION OF CARE: 7:08 PM- Will treat symptomatically. Informed pt of negative labs. Encouraged increase in fluid intake.  Return precautions discussed. Pt verbalizes understanding and agrees to plan.  Medications  acetaminophen (TYLENOL) tablet 650 mg (650 mg Oral Given 05/26/16 1614)   Labs (all labs ordered are listed, but only abnormal results are displayed) Labs Reviewed  COMPREHENSIVE METABOLIC PANEL - Abnormal; Notable for the following:       Result Value   Sodium 133 (*)    Calcium 8.6 (*)    Total Protein 6.2 (*)    Albumin 3.2 (*)    ALT 14 (*)    All other components within normal limits  CBC WITH DIFFERENTIAL/PLATELET - Abnormal; Notable for the following:    WBC 11.9 (*)    Neutro Abs 10.0 (*)    All other components within normal limits  I-STAT CG4 LACTIC ACID, ED  I-STAT CG4 LACTIC ACID, ED    EKG  EKG Interpretation None       Radiology Dg Chest 2 View  Result Date: 05/26/2016 CLINICAL DATA:  Cough, fever, congestion, body aches, smoking history EXAM: CHEST  2 VIEW COMPARISON:  Chest x-ray of 04/17/2016 FINDINGS: No active infiltrate or effusion is seen. There are somewhat prominent perihilar markings present which may indicate bronchitis. Mediastinal and hilar contours are unremarkable. The heart is within normal limits in size. No bony abnormality is seen. IMPRESSION: No pneumonia.  Question bronchitis. Electronically Signed   By: Dwyane Dee M.D.   On: 05/26/2016 16:50    Procedures Procedures (including critical care time)  Medications Ordered in ED Medications  acetaminophen (TYLENOL) tablet 650 mg (650 mg Oral Given 05/26/16 1614)     Initial Impression / Assessment and Plan / ED Course  I have reviewed the triage vital signs and the nursing notes.  Pertinent labs & imaging results that were available during my care of the patient were reviewed by me and considered in my medical decision making (see chart for details).    Symptoms are consistent with influenza. There is no evidence to suggest pneumonia on mCXR.  I discussed supportive treatment. I encouraged followup  with the primary care doctor next week if symptoms have not resolved. Warning signs and reasons to return to the emergency room were discussed    Final Clinical Impressions(s) / ED Diagnoses   Final diagnoses:  Influenza    New Prescriptions New Prescriptions   BENZONATATE (TESSALON) 100 MG CAPSULE    Take 1 capsule (100 mg total) by mouth every 8 (eight) hours.   NAPROXEN (NAPROSYN) 500 MG TABLET    Take 1 tablet (500 mg total) by mouth 2 (two) times daily.   I personally performed the services described in this documentation, which was scribed in my presence.  The recorded information has been reviewed and is accurate.     Linwood Dibbles, MD 05/26/16 [phone]

## 2016-05-26 NOTE — ED Notes (Signed)
Pt verbalized understanding of DC teaching NAD.

## 2016-05-26 NOTE — Discharge Instructions (Signed)
Follow-up with a primary care doctor if not improving in the next week, you can also take Tylenol in addition to the Naprosyn & Tessalon, return to the emergency room as needed for worsening symptoms

## 2016-05-26 NOTE — ED Triage Notes (Signed)
Pt arrives via [facility] for flu like symptoms since Sunday, pt had tylenol around 0800 this am. Pt had temp of 103 with ems pta. Pt a/o, nad.

## 2016-05-26 NOTE — ED Notes (Signed)
ED Provider at bedside. 

## 2016-07-21 ENCOUNTER — Emergency Department (HOSPITAL_COMMUNITY)
Admission: EM | Admit: 2016-07-21 | Discharge: 2016-07-21 | Disposition: A | Discharge status: Home / Self Care | Payer: Medicaid Other | Attending: Emergency Medicine | Admitting: Emergency Medicine

## 2016-07-21 ENCOUNTER — Encounter (HOSPITAL_COMMUNITY): Payer: Self-pay | Admitting: Emergency Medicine

## 2016-07-21 DIAGNOSIS — N489 Disorder of penis, unspecified: Secondary | ICD-10-CM

## 2016-07-21 DIAGNOSIS — Z79899 Other long term (current) drug therapy: Secondary | ICD-10-CM | POA: Insufficient documentation

## 2016-07-21 DIAGNOSIS — N5089 Other specified disorders of the male genital organs: Secondary | ICD-10-CM | POA: Insufficient documentation

## 2016-07-21 DIAGNOSIS — J45909 Unspecified asthma, uncomplicated: Secondary | ICD-10-CM | POA: Insufficient documentation

## 2016-07-21 DIAGNOSIS — F17200 Nicotine dependence, unspecified, uncomplicated: Secondary | ICD-10-CM | POA: Insufficient documentation

## 2016-07-21 LAB — GC/CHLAMYDIA PROBE AMP (~~LOC~~) NOT AT ARMC
CHLAMYDIA, DNA PROBE: NEGATIVE
NEISSERIA GONORRHEA: NEGATIVE

## 2016-07-21 MED ORDER — ACYCLOVIR 400 MG PO TABS: 400.0000 mg | ORAL_TABLET | Freq: Three times a day (TID) | ORAL | 0 refills | Status: AC

## 2016-07-21 NOTE — ED Triage Notes (Signed)
Pt arrives with c/o "bumps" to penis, states they recently appeared. States cluster of 5 "white heads" in one localized area. States partner recently diagnosed with HPV. Pt states he doesn't think he has any STDs because he just got out of prison in November. States he has stinging type pain.  Pt outside smoking then over to vending machine prior to checking in.

## 2016-07-21 NOTE — Discharge Instructions (Signed)
Please read and follow all provided instructions.  Your diagnoses today include:  1. Penile lesion     Tests performed today include:  Test for gonorrhea and chlamydia.   Vital signs. See below for your results today.   Medications:   Acyclovir - medication to treat genital herpes   Home care instructions:  Read educational materials contained in this packet and follow any instructions provided.   You should tell your partners about your infection and avoid having sex for one week to allow time for the medicine to work.  Sexually transmitted disease testing also available at:   [facility], [facility]  [address] [place], phone [phone] or [phone]    Monday - Friday, call for an appointment  [facility], [facility]  [address], [place], phone [phone] or [phone]   Monday - Friday, call for an appointment  Return instructions:   Please return to the Emergency Department if you experience worsening symptoms.   Please return if you have any other emergent concerns.  Additional Information:  Your vital signs today were: BP 121/70 (BP Location: Left Arm)    Pulse 69    Temp 97.7 F (36.5 C) (Oral)    Resp 16    SpO2 95%  If your blood pressure (BP) was elevated above 135/85 this visit, please have this repeated by your doctor within one month. --------------

## 2016-07-21 NOTE — ED Provider Notes (Signed)
[facility] Provider Note   CSN: [id number] Arrival date & time: 07/21/16  0403     History   Chief Complaint Chief Complaint  Patient presents with  . "bumps" to genitalia    HPI Stephen Golden is a 26 y.o. male.  Patients presents with complaint of "bumps" to his penis starting approximately 5-7 days ago. They are painful. He states that a sexual partner was recently diagnosed with HPV. He denies any dysuria or penile discharge. He remembers having similar experience in the past in the same area but then went away. He denies any drainage. Initially appeared to be "whiteheads". Denies fever or other symptoms. Reports having 4 sexual partners in the past 5 months. No history of kidney problems.       Past Medical History:  Diagnosis Date  . Asthma     There are no active problems to display for this patient.   Past Surgical History:  Procedure Laterality Date  . LEG SURGERY Bilateral    26 years old       Home Medications    Prior to Admission medications   Medication Sig Start Date End Date Taking? Authorizing Provider  acyclovir (ZOVIRAX) 400 MG tablet Take 1 tablet (400 mg total) by mouth 3 (three) times daily. 07/21/16   Renne Crigler, PA-C  benzonatate (TESSALON) 100 MG capsule Take 1 capsule (100 mg total) by mouth every 8 (eight) hours. 05/26/16   Linwood Dibbles, MD  diclofenac sodium (VOLTAREN) 1 % GEL Apply 2 g topically 4 (four) times daily as needed (for pain). 05/10/16   Shawn C Joy, PA-C  ibuprofen (ADVIL,MOTRIN) 800 MG tablet Take 1 tablet (800 mg total) by mouth every 8 (eight) hours as needed for mild pain. 04/17/16   Kristen N Ward, DO  naproxen (NAPROSYN) 500 MG tablet Take 1 tablet (500 mg total) by mouth 2 (two) times daily. 05/26/16   Linwood Dibbles, MD    Family History No family history on file.  Social History Social History  Substance Use Topics  . Smoking status: Current Every Day Smoker  . Smokeless tobacco: Never Used  . Alcohol use No      Allergies   Patient has no known allergies.   Review of Systems Review of Systems  Constitutional: Negative for fever.  HENT: Negative for sore throat.   Eyes: Negative for discharge.  Gastrointestinal: Negative for rectal pain.  Genitourinary: Positive for penile pain. Negative for discharge, dysuria, frequency, genital sores and testicular pain.  Musculoskeletal: Negative for arthralgias.  Skin: Positive for rash.  Hematological: Negative for adenopathy.     Physical Exam Updated Vital Signs BP 121/70 (BP Location: Left Arm)   Pulse 69   Temp 97.7 F (36.5 C) (Oral)   Resp 16   SpO2 95%   Physical Exam  Constitutional: He appears well-developed and well-nourished.  HENT:  Head: Normocephalic and atraumatic.  Eyes: Conjunctivae are normal.  Neck: Normal range of motion. Neck supple.  Pulmonary/Chest: No respiratory distress.  Genitourinary: Circumcised.     Neurological: He is alert.  Skin: Skin is warm and dry.  Psychiatric: He has a normal mood and affect.  Nursing note and vitals reviewed.    ED Treatments / Results  Labs (all labs ordered are listed, but only abnormal results are displayed) Labs Reviewed  GC/CHLAMYDIA PROBE AMP ([place]) NOT AT [facility]     Procedures Procedures (including critical care time)  Medications Ordered in ED Medications - No data to display  Initial Impression / Assessment and Plan / ED Course  I have reviewed the triage vital signs and the nursing notes.  Pertinent labs & imaging results that were available during my care of the patient were reviewed by me and considered in my medical decision making (see chart for details).     Patient seen and examined.   Vital signs reviewed and are as follows: BP 121/70 (BP Location: Left Arm)   Pulse 69   Temp 97.7 F (36.5 C) (Oral)   Resp 16   SpO2 95%   Discussed possible differentials with the patient. Appearance is not classic for herpes, however patient  has had similar symptoms in the same area in the past. Lesions are painful. It may be that the initial vesicles eroded and are now resolving. This does not have the appearance of HPV infection.  Patient is agreeable to a course of acyclovir to see if this helps his symptoms. Counseled on safe sexual practices.  I offered testing for gonorrhea and chlamydia and patient accepts. I also offered testing for HIV and syphilis, patient declines. I encouraged him to follow-up with the health department if he changes his mind.  Final Clinical Impressions(s) / ED Diagnoses   Final diagnoses:  Penile lesion   Penile lesion, unclear etiology. Possible herpes infection given pain, appearance, and recurrence.   New Prescriptions New Prescriptions   ACYCLOVIR (ZOVIRAX) 400 MG TABLET    Take 1 tablet (400 mg total) by mouth 3 (three) times daily.     Renne Crigler, PA-C 07/21/16 0500    Gilda Crease, MD 07/21/16 [phone]

## 2016-08-06 ENCOUNTER — Encounter (HOSPITAL_COMMUNITY): Payer: Self-pay | Admitting: Emergency Medicine

## 2016-08-06 ENCOUNTER — Emergency Department (HOSPITAL_COMMUNITY): Payer: Medicaid Other

## 2016-08-06 DIAGNOSIS — R0789 Other chest pain: Secondary | ICD-10-CM | POA: Insufficient documentation

## 2016-08-06 DIAGNOSIS — J45909 Unspecified asthma, uncomplicated: Secondary | ICD-10-CM | POA: Insufficient documentation

## 2016-08-06 DIAGNOSIS — Z79899 Other long term (current) drug therapy: Secondary | ICD-10-CM | POA: Insufficient documentation

## 2016-08-06 DIAGNOSIS — F17200 Nicotine dependence, unspecified, uncomplicated: Secondary | ICD-10-CM | POA: Insufficient documentation

## 2016-08-06 LAB — BASIC METABOLIC PANEL
Anion gap: 7 (ref 5–15)
BUN: 9 mg/dL (ref 6–20)
CHLORIDE: 108 mmol/L (ref 101–111)
CO2: 23 mmol/L (ref 22–32)
CREATININE: 0.92 mg/dL (ref 0.61–1.24)
Calcium: 8.8 mg/dL — ABNORMAL LOW (ref 8.9–10.3)
GFR calc Af Amer: >60 mL/min (ref >60)
GFR calc non Af Amer: >60 mL/min (ref >60)
Glucose, Bld: 95 mg/dL (ref 65–99)
Potassium: 3.9 mmol/L (ref 3.5–5.1)
SODIUM: 138 mmol/L (ref 135–145)

## 2016-08-06 LAB — CBC
HCT: 41.1 % (ref 39.0–52.0)
Hemoglobin: 13.5 g/dL (ref 13.0–17.0)
MCH: 28 pg (ref 26.0–34.0)
MCHC: 32.8 g/dL (ref 30.0–36.0)
MCV: 85.1 fL (ref 78.0–100.0)
PLATELETS: 257 10*3/uL (ref 150–400)
RBC: 4.83 MIL/uL (ref 4.22–5.81)
RDW: 14 % (ref 11.5–15.5)
WBC: 5.4 10*3/uL (ref 4.0–10.5)

## 2016-08-06 LAB — I-STAT TROPONIN, ED: Troponin i, poc: 0 ng/mL (ref 0.00–0.08)

## 2016-08-06 NOTE — ED Triage Notes (Signed)
Pt. reports intermittent /chronic central chest pain onset last year , mild SOB , no nausea or diaphoresis , pain increases when sitting/lying and in cold weather . Denies cough or fever .

## 2016-08-07 ENCOUNTER — Emergency Department (HOSPITAL_COMMUNITY): Payer: Medicaid Other

## 2016-08-07 ENCOUNTER — Emergency Department (HOSPITAL_COMMUNITY)
Admission: EM | Admit: 2016-08-07 | Discharge: 2016-08-07 | Disposition: A | Discharge status: Home / Self Care | Payer: Medicaid Other | Attending: Emergency Medicine | Admitting: Emergency Medicine

## 2016-08-07 DIAGNOSIS — R52 Pain, unspecified: Secondary | ICD-10-CM

## 2016-08-07 DIAGNOSIS — R0789 Other chest pain: Secondary | ICD-10-CM

## 2016-08-07 LAB — HEPATIC FUNCTION PANEL
ALK PHOS: 40 U/L (ref 38–126)
ALT: 10 U/L — AB (ref 17–63)
AST: 21 U/L (ref 15–41)
Albumin: 3.4 g/dL — ABNORMAL LOW (ref 3.5–5.0)
BILIRUBIN TOTAL: 0.4 mg/dL (ref 0.3–1.2)
TOTAL PROTEIN: 5.7 g/dL — AB (ref 6.5–8.1)

## 2016-08-07 LAB — LIPASE, BLOOD: Lipase: 18 U/L (ref 11–51)

## 2016-08-07 MED ORDER — IBUPROFEN 600 MG PO TABS: 600.0000 mg | ORAL_TABLET | Freq: Three times a day (TID) | ORAL | 0 refills | Status: AC | PRN

## 2016-08-07 NOTE — ED Notes (Signed)
Had to ask pt to leave room 3 times... Pt kept falling asleep.

## 2016-08-07 NOTE — ED Provider Notes (Signed)
[facility] Provider Note   CSN: [id number] Arrival date & time: 08/06/16  2311  By signing my name below, I, Cynda Acres, attest that this documentation has been prepared under the direction and in the presence of Jacalyn Lefevre, MD. Electronically Signed: Cynda Acres, Scribe. 08/07/16. 3:08 AM.  History   Chief Complaint Chief Complaint  Patient presents with  . Chest Pain    HPI Comments: Stephen Golden is a 26 y.o. male with a history of asthma, who presents to the Emergency Department complaining of sudden-onset, intermittent central chest pain that began one year ago. Patient reports chest pian for one year. Patient has never seen a provider for this problem. Patient states he is unable to sleep due to this pain. Patient reports associated mild shortness of breath. No modifying factors indicated. Patient does not take any medications chronically. Patient denies nay abdominal surgical history. Patient denies alcohol or tobacco use. Patient denies any diarrhea, nausea, vomiting, fever, chills, or abdominal pain.   The history is provided by the patient. No language interpreter was used.    Past Medical History:  Diagnosis Date  . Asthma     There are no active problems to display for this patient.   Past Surgical History:  Procedure Laterality Date  . LEG SURGERY Bilateral    26 years old       Home Medications    Prior to Admission medications   Medication Sig Start Date End Date Taking? Authorizing Provider  acyclovir (ZOVIRAX) 400 MG tablet Take 1 tablet (400 mg total) by mouth 3 (three) times daily. Patient not taking: Reported on 08/07/2016 07/21/16   Renne Crigler, PA-C  benzonatate (TESSALON) 100 MG capsule Take 1 capsule (100 mg total) by mouth every 8 (eight) hours. Patient not taking: Reported on 08/07/2016 05/26/16   Linwood Dibbles, MD  diclofenac sodium (VOLTAREN) 1 % GEL Apply 2 g topically 4 (four) times daily as needed (for pain). Patient not  taking: Reported on 08/07/2016 05/10/16   Shawn C Joy, PA-C  ibuprofen (ADVIL,MOTRIN) 600 MG tablet Take 1 tablet (600 mg total) by mouth every 8 (eight) hours as needed for mild pain. 08/07/16   Jacalyn Lefevre, MD  naproxen (NAPROSYN) 500 MG tablet Take 1 tablet (500 mg total) by mouth 2 (two) times daily. Patient not taking: Reported on 08/07/2016 05/26/16   Linwood Dibbles, MD    Family History No family history on file.  Social History Social History  Substance Use Topics  . Smoking status: Current Every Day Smoker  . Smokeless tobacco: Never Used  . Alcohol use No     Allergies   Patient has no known allergies.   Review of Systems Review of Systems  Constitutional: Negative for chills and fever.  Respiratory: Positive for shortness of breath (mild).   Cardiovascular: Positive for chest pain (central).  Gastrointestinal: Negative for abdominal pain, diarrhea, nausea and vomiting.  All other systems reviewed and are negative.    Physical Exam Updated Vital Signs BP 109/67 (BP Location: Left Arm)   Pulse (!) 59   Temp 98.2 F (36.8 C) (Oral)   Resp 18   SpO2 98%   Physical Exam  Constitutional: He is oriented to person, place, and time. He appears well-developed.  HENT:  Head: Normocephalic and atraumatic.  Mouth/Throat: Oropharynx is clear and moist.  Eyes: Conjunctivae and EOM are normal. Pupils are equal, round, and reactive to light.  Neck: Normal range of motion. Neck supple.  Cardiovascular: Normal rate,  regular rhythm and normal heart sounds.   Pulmonary/Chest: Effort normal and breath sounds normal. He has no wheezes. He exhibits tenderness.  Right lower chest tenderness.   Abdominal: Soft. Bowel sounds are normal. There is no tenderness. There is no guarding.  RUQ tenderness.   Musculoskeletal: Normal range of motion.  Neurological: He is alert and oriented to person, place, and time.  Skin: Skin is warm and dry.  Psychiatric: He has a normal mood and affect.    Nursing note and vitals reviewed.    ED Treatments / Results  DIAGNOSTIC STUDIES: Oxygen Saturation is 99% on RA, normal by my interpretation.    COORDINATION OF CARE: 2:11 AM Discussed treatment plan with pt at bedside and pt agreed to plan, which includes imaging.   Labs (all labs ordered are listed, but only abnormal results are displayed) Labs Reviewed  BASIC METABOLIC PANEL - Abnormal; Notable for the following:       Result Value   Calcium 8.8 (*)    All other components within normal limits  HEPATIC FUNCTION PANEL - Abnormal; Notable for the following:    Total Protein 5.7 (*)    Albumin 3.4 (*)    ALT 10 (*)    Bilirubin, Direct <0.1 (*)    All other components within normal limits  CBC  LIPASE, BLOOD  I-STAT TROPOININ, ED    EKG  EKG Interpretation  Date/Time:  Thursday August 06 2016 23:17:45 EDT Ventricular Rate:  59 PR Interval:  168 QRS Duration: 98 QT Interval:  380 QTC Calculation: 376 R Axis:   86 Text Interpretation:  Sinus bradycardia ST elevation, consider early repolarization Borderline ECG No significant change was found Confirmed by [facility] MD, Valrie Jia ([id number]) on 08/07/2016 2:09:26 AM       Radiology Dg Chest 2 View  Result Date: 08/06/2016 CLINICAL DATA:  Chronic intermittent central chest pain and shortness of breath. Initial encounter. EXAM: CHEST  2 VIEW COMPARISON:  Chest radiograph performed 05/26/2016 FINDINGS: The lungs are well-aerated and clear. There is no evidence of focal opacification, pleural effusion or pneumothorax. The heart is normal in size; the mediastinal contour is within normal limits. No acute osseous abnormalities are seen. IMPRESSION: No acute cardiopulmonary process seen. Electronically Signed   By: Roanna Raider M.D.   On: 08/06/2016 23:45   US Abdomen Limited Ruq  Result Date: 08/07/2016 CLINICAL DATA:  Epigastric pain tonight. Pain for 1 year but worse tonight. EXAM: US ABDOMEN LIMITED - RIGHT UPPER QUADRANT  COMPARISON:  None. FINDINGS: Gallbladder: No gallstones or wall thickening visualized. No sonographic Murphy sign noted by sonographer. Common bile duct: Diameter: 2.2 mm, normal Liver: No focal lesion identified. Within normal limits in parenchymal echogenicity. IMPRESSION: Normal examination. Electronically Signed   By: Burman Nieves M.D.   On: 08/07/2016 03:00    Procedures Procedures (including critical care time)  Medications Ordered in ED Medications - No data to display   Initial Impression / Assessment and Plan / ED Course  I have reviewed the triage vital signs and the nursing notes.  Pertinent labs & imaging results that were available during my care of the patient were reviewed by me and considered in my medical decision making (see chart for details).    Pt's pain is atypical and has been going on for 1 year.  Pain is reproducible.  Pt is stable for d/c.  He knows to return if worse.  Final Clinical Impressions(s) / ED Diagnoses   Final diagnoses:  Pain  Atypical chest pain    New Prescriptions Current Discharge Medication List     I personally performed the services described in this documentation, which was scribed in my presence. The recorded information has been reviewed and is accurate.     Jacalyn Lefevre, MD 08/07/16 [phone]

## 2016-11-19 ENCOUNTER — Encounter (HOSPITAL_COMMUNITY): Payer: Self-pay | Admitting: Emergency Medicine

## 2016-11-19 ENCOUNTER — Emergency Department (HOSPITAL_COMMUNITY)
Admission: EM | Admit: 2016-11-19 | Discharge: 2016-11-19 | Discharge status: Against Medical Advice | Payer: Medicaid Other | Attending: Emergency Medicine | Admitting: Emergency Medicine

## 2016-11-19 DIAGNOSIS — M79651 Pain in right thigh: Secondary | ICD-10-CM | POA: Insufficient documentation

## 2016-11-19 DIAGNOSIS — M79652 Pain in left thigh: Secondary | ICD-10-CM | POA: Insufficient documentation

## 2016-11-19 NOTE — ED Triage Notes (Signed)
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  Pt states he got locked up shortly after the surgery and did not follow up with them.  Pt states he has pain all the time intermittently since.

## 2016-11-19 NOTE — ED Notes (Signed)
Called name for hallway bed X3 with answer

## 2017-01-07 DIAGNOSIS — M79605 Pain in left leg: Secondary | ICD-10-CM | POA: Insufficient documentation

## 2017-01-07 DIAGNOSIS — M79604 Pain in right leg: Secondary | ICD-10-CM | POA: Insufficient documentation

## 2017-01-07 DIAGNOSIS — Z5321 Procedure and treatment not carried out due to patient leaving prior to being seen by health care provider: Secondary | ICD-10-CM | POA: Insufficient documentation

## 2017-01-08 ENCOUNTER — Emergency Department (HOSPITAL_COMMUNITY)
Admission: EM | Admit: 2017-01-08 | Discharge: 2017-01-08 | Disposition: A | Discharge status: Home / Self Care | Payer: Self-pay | Attending: Emergency Medicine | Admitting: Emergency Medicine

## 2017-01-08 ENCOUNTER — Encounter (HOSPITAL_COMMUNITY): Payer: Self-pay | Admitting: Emergency Medicine

## 2017-01-08 DIAGNOSIS — M25552 Pain in left hip: Secondary | ICD-10-CM | POA: Insufficient documentation

## 2017-01-08 DIAGNOSIS — F17210 Nicotine dependence, cigarettes, uncomplicated: Secondary | ICD-10-CM | POA: Insufficient documentation

## 2017-01-08 DIAGNOSIS — J45909 Unspecified asthma, uncomplicated: Secondary | ICD-10-CM | POA: Insufficient documentation

## 2017-01-08 DIAGNOSIS — M79604 Pain in right leg: Secondary | ICD-10-CM

## 2017-01-08 DIAGNOSIS — M79605 Pain in left leg: Secondary | ICD-10-CM

## 2017-01-08 DIAGNOSIS — Z791 Long term (current) use of non-steroidal anti-inflammatories (NSAID): Secondary | ICD-10-CM | POA: Insufficient documentation

## 2017-01-08 DIAGNOSIS — Z79899 Other long term (current) drug therapy: Secondary | ICD-10-CM | POA: Insufficient documentation

## 2017-01-08 NOTE — ED Triage Notes (Signed)
Patient arrives with complaint of bilateral leg pain. States he had corrective surgery done on his legs at age 26. Patient has been incarcerated until recently. For the past 3-4 years he has had pain in bilateral hips. States that when he walks pain is better, but if he sits or relaxes the pain becomes severe. Explains that his incarceration prevented him from getting the entire course of rehab post surgery.

## 2017-01-08 NOTE — ED Triage Notes (Signed)
C/o L hip pain x 3-4 years.  Reports history of surgery for same and states he wasn't able to complete rehab due to being in prison.  Denies recent injury.

## 2017-01-09 ENCOUNTER — Emergency Department (HOSPITAL_COMMUNITY)
Admission: EM | Admit: 2017-01-09 | Discharge: 2017-01-09 | Disposition: A | Discharge status: Home / Self Care | Payer: Self-pay | Attending: Emergency Medicine | Admitting: Emergency Medicine

## 2017-01-09 ENCOUNTER — Emergency Department (HOSPITAL_COMMUNITY): Payer: Self-pay

## 2017-01-09 DIAGNOSIS — M25559 Pain in unspecified hip: Secondary | ICD-10-CM

## 2017-01-09 DIAGNOSIS — M25552 Pain in left hip: Secondary | ICD-10-CM | POA: Insufficient documentation

## 2017-01-09 DIAGNOSIS — M25551 Pain in right hip: Secondary | ICD-10-CM | POA: Insufficient documentation

## 2017-01-09 DIAGNOSIS — F17200 Nicotine dependence, unspecified, uncomplicated: Secondary | ICD-10-CM | POA: Insufficient documentation

## 2017-01-09 DIAGNOSIS — J45909 Unspecified asthma, uncomplicated: Secondary | ICD-10-CM | POA: Insufficient documentation

## 2017-01-09 DIAGNOSIS — G8929 Other chronic pain: Secondary | ICD-10-CM | POA: Insufficient documentation

## 2017-01-09 MED ORDER — MELOXICAM 7.5 MG PO TABS: 15.0000 mg | ORAL_TABLET | Freq: Every day | ORAL | 0 refills | Status: AC

## 2017-01-09 NOTE — Discharge Instructions (Signed)
Take the prescribed medication as directed. °Follow-up with orthopedics-- call to make an appt. °Return to the ED for new or worsening symptoms. °

## 2017-01-09 NOTE — ED Provider Notes (Signed)
[facility]-EMERGENCY DEPT Provider Note   CSN: [id number] Arrival date & time: 01/08/17  2149     History   Chief Complaint Chief Complaint  Patient presents with  . Hip Pain    HPI DISHON KEHOE is a 26 y.o. male.  The history is provided by the patient and medical records.  Hip Pain     26 y.o. M with hx of asthma, presenting to the ED for bilateral hip pain.  Patient states he had surgery on his hips about 10 years ago to corrected his gait as he was very "bow legged".  States he had his femurs broken and turned outward and external fixator applied. States shortly after this was removed he was incarcerated and has been in prison for the past 10 years or so. States because of this he did not get any appropriate physical therapy and basically had to do his own rehabilitation in his cell. States he has had ongoing pain in his hips but has been worse over the past few weeks. States when walking his legs will "give out" from under him. This does not cause any falls as he is able to catch himself. He denies any numbness or weakness of his legs. He has not had any orthopedic follow-up since his initial surgery.  Past Medical History:  Diagnosis Date  . Asthma     There are no active problems to display for this patient.   Past Surgical History:  Procedure Laterality Date  . LEG SURGERY Bilateral    26 years old       Home Medications    Prior to Admission medications   Medication Sig Start Date End Date Taking? Authorizing Provider  acyclovir (ZOVIRAX) 400 MG tablet Take 1 tablet (400 mg total) by mouth 3 (three) times daily. Patient not taking: Reported on 08/07/2016 07/21/16   Renne Crigler, PA-C  benzonatate (TESSALON) 100 MG capsule Take 1 capsule (100 mg total) by mouth every 8 (eight) hours. Patient not taking: Reported on 08/07/2016 05/26/16   Linwood Dibbles, MD  diclofenac sodium (VOLTAREN) 1 % GEL Apply 2 g topically 4 (four) times daily as needed (for pain). Patient not  taking: Reported on 08/07/2016 05/10/16   Anselm Pancoast, PA-C  ibuprofen (ADVIL,MOTRIN) 600 MG tablet Take 1 tablet (600 mg total) by mouth every 8 (eight) hours as needed for mild pain. 08/07/16   Jacalyn Lefevre, MD  naproxen (NAPROSYN) 500 MG tablet Take 1 tablet (500 mg total) by mouth 2 (two) times daily. Patient not taking: Reported on 08/07/2016 05/26/16   Linwood Dibbles, MD    Family History No family history on file.  Social History Social History  Substance Use Topics  . Smoking status: Current Every Day Smoker  . Smokeless tobacco: Never Used  . Alcohol use No     Allergies   Patient has no known allergies.   Review of Systems Review of Systems  Musculoskeletal: Positive for arthralgias.  All other systems reviewed and are negative.    Physical Exam Updated Vital Signs BP 112/62 (BP Location: Left Arm)   Pulse 74   Temp 98.2 F (36.8 C) (Oral)   Resp 16   SpO2 98%   Physical Exam  Constitutional: He is oriented to person, place, and time. He appears well-developed and well-nourished.  HENT:  Head: Normocephalic and atraumatic.  Mouth/Throat: Oropharynx is clear and moist.  Eyes: Pupils are equal, round, and reactive to light. Conjunctivae and EOM are normal.  Neck: Normal  range of motion.  Cardiovascular: Normal rate, regular rhythm and normal heart sounds.   Pulmonary/Chest: Effort normal and breath sounds normal.  Abdominal: Soft. Bowel sounds are normal.  Musculoskeletal: Normal range of motion.  Well healed scars of bilateral proximal outer thighs; pelvis feels stable; he is able to flex/extend both legs at the hips and knees without apparent issue; no apparent edema, no leg shortening, no overlying erythema or cellulitic changes  Neurological: He is alert and oriented to person, place, and time.  Skin: Skin is warm and dry.  Psychiatric: He has a normal mood and affect.  Nursing note and vitals reviewed.    ED Treatments / Results  Labs (all labs  ordered are listed, but only abnormal results are displayed) Labs Reviewed - No data to display  EKG  EKG Interpretation None       Radiology Dg Hips Bilat With Pelvis 3-4 Views  Result Date: 01/09/2017 CLINICAL DATA:  Pain along the lateral aspect of both hips EXAM: DG HIP (WITH OR WITHOUT PELVIS) 3-4V BILAT COMPARISON:  None. FINDINGS: No acute displaced fracture or malalignment is seen. Evidence of old postsurgical or posttraumatic deformity of the proximal femurs with suggestion of prior hardware bilaterally. Pubic symphysis appears intact. SI joints are patent. IMPRESSION: No acute osseous abnormality. 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 Electronically Signed   By: Jasmine Pang M.D.   On: 01/09/2017 03:30    Procedures Procedures (including critical care time)  Medications Ordered in ED Medications - No data to display   Initial Impression / Assessment and Plan / ED Course  I have reviewed the triage vital signs and the nursing notes.  Pertinent labs & imaging results that were available during my care of the patient were reviewed by me and considered in my medical decision making (see chart for details).  26 year old male here with bilateral hip pain. Reports surgery around 10 years ago to correct his gait as he reports he was very "bowlegged".  He was subsequently incarcerated after the surgery and did not get planned PT so tried to "rehab himself" in prison. He has not had any follow-up since his original surgery.  No acute deformities noted on exam here, specifically no swelling, leg shortening, or bony deformities. Gait is steady. Screening x-ray without acute findings.  Patient will need close follow-up with orthopedics given his prior surgery-- referred to on call physician.  Can start daily mobic to try and help with symptoms.  Discussed plan with patient, he acknowledged understanding and agreed with plan of care.  Return precautions  given for new or worsening symptoms.  Final Clinical Impressions(s) / ED Diagnoses   Final diagnoses:  Hip pain    New Prescriptions Discharge Medication List as of 01/09/2017  3:45 AM    START taking these medications   Details  meloxicam (MOBIC) 7.5 MG tablet Take 2 tablets (15 mg total) by mouth daily., Starting Sat 01/09/2017, Print         Garlon Hatchet, PA-C 01/09/17 0426    Ward, Layla Maw, DO 01/09/17 [phone]

## 2017-01-10 ENCOUNTER — Encounter (HOSPITAL_COMMUNITY): Payer: Self-pay | Admitting: *Deleted

## 2017-01-10 ENCOUNTER — Emergency Department (HOSPITAL_COMMUNITY)
Admission: EM | Admit: 2017-01-10 | Discharge: 2017-01-10 | Disposition: A | Discharge status: Home / Self Care | Payer: Self-pay | Attending: Emergency Medicine | Admitting: Emergency Medicine

## 2017-01-10 DIAGNOSIS — G8929 Other chronic pain: Secondary | ICD-10-CM

## 2017-01-10 NOTE — ED Notes (Signed)
No answer x2 

## 2017-01-10 NOTE — ED Triage Notes (Signed)
Pt stated "I had hip surgery about 10 years ago.  I couldn't go to rehab after my surgery because I was in prison.  I basically had to teach myself how to walk in that little room.  My legs be giving out on me at work."

## 2017-01-10 NOTE — ED Provider Notes (Signed)
[facility]-EMERGENCY DEPT Provider Note   CSN: [id number] Arrival date & time: 01/09/17  2219     History   Chief Complaint Chief Complaint  Patient presents with  . Hip Pain    bilateral    HPI Stephen Golden is a 26 y.o. male.  The history is provided by the patient.  Hip Pain  This is a chronic problem. The current episode started more than 1 week ago (10 years). The problem occurs constantly. The problem has not changed since onset.Pertinent negatives include no chest pain, no abdominal pain, no headaches and no shortness of breath. Nothing aggravates the symptoms. Nothing relieves the symptoms. He has tried nothing for the symptoms. The treatment provided no relief.    Past Medical History:  Diagnosis Date  . Asthma     There are no active problems to display for this patient.   Past Surgical History:  Procedure Laterality Date  . LEG SURGERY Bilateral    26 years old       Home Medications    Prior to Admission medications   Medication Sig Start Date End Date Taking? Authorizing Provider  acyclovir (ZOVIRAX) 400 MG tablet Take 1 tablet (400 mg total) by mouth 3 (three) times daily. Patient not taking: Reported on 08/07/2016 07/21/16   Renne Crigler, PA-C  benzonatate (TESSALON) 100 MG capsule Take 1 capsule (100 mg total) by mouth every 8 (eight) hours. Patient not taking: Reported on 08/07/2016 05/26/16   Linwood Dibbles, MD  diclofenac sodium (VOLTAREN) 1 % GEL Apply 2 g topically 4 (four) times daily as needed (for pain). Patient not taking: Reported on 08/07/2016 05/10/16   Anselm Pancoast, PA-C  ibuprofen (ADVIL,MOTRIN) 600 MG tablet Take 1 tablet (600 mg total) by mouth every 8 (eight) hours as needed for mild pain. 08/07/16   Jacalyn Lefevre, MD  meloxicam (MOBIC) 7.5 MG tablet Take 2 tablets (15 mg total) by mouth daily. 01/09/17   Garlon Hatchet, PA-C  naproxen (NAPROSYN) 500 MG tablet Take 1 tablet (500 mg total) by mouth 2 (two) times daily. Patient not taking:  Reported on 08/07/2016 05/26/16   Linwood Dibbles, MD    Family History No family history on file.  Social History Social History  Substance Use Topics  . Smoking status: Current Every Day Smoker  . Smokeless tobacco: Never Used  . Alcohol use No     Allergies   Patient has no known allergies.   Review of Systems Review of Systems  Respiratory: Negative for shortness of breath.   Cardiovascular: Negative for chest pain.  Gastrointestinal: Negative for abdominal pain.  Musculoskeletal: Positive for arthralgias. Negative for back pain.  Neurological: Negative for tremors, syncope, speech difficulty, weakness and headaches.  All other systems reviewed and are negative.    Physical Exam Updated Vital Signs BP 104/65 (BP Location: Left Arm)   Pulse (!) 52   Temp 97.8 F (36.6 C) (Oral)   Resp 16   Ht  (1.778 m)   Wt 81.6 kg (180 lb)   SpO2 100%   BMI 25.83 kg/m   Physical Exam  Constitutional: He is oriented to person, place, and time. He appears well-developed and well-nourished. No distress.  HENT:  Head: Normocephalic and atraumatic.  Nose: Nose normal.  Mouth/Throat: No oropharyngeal exudate.  Eyes: Pupils are equal, round, and reactive to light. Conjunctivae and EOM are normal.  Neck: Normal range of motion. Neck supple.  Cardiovascular: Normal rate, regular rhythm, normal heart sounds and  intact distal pulses.   Pulmonary/Chest: Effort normal and breath sounds normal. He has no wheezes. He has no rales.  Abdominal: Soft. Bowel sounds are normal. He exhibits no mass. There is no tenderness. There is no rebound and no guarding.  Musculoskeletal: Normal range of motion. He exhibits no edema, tenderness or deformity.  Neurological: He is alert and oriented to person, place, and time. He displays normal reflexes.  Skin: Skin is warm and dry. Capillary refill takes less than 2 seconds.  Psychiatric: He has a normal mood and affect.     ED Treatments / Results    Vitals:   01/09/17 2342 01/10/17 0203  BP: 111/61 104/65  Pulse: 65 (!) 52  Resp: 18 16  Temp:  97.8 F (36.6 C)  SpO2: 99% 100%    Radiology Dg Hips Bilat With Pelvis 3-4 Views  Result Date: 01/09/2017 CLINICAL DATA:  Pain along the lateral aspect of both hips EXAM: DG HIP (WITH OR WITHOUT PELVIS) 3-4V BILAT COMPARISON:  None. FINDINGS: No acute displaced fracture or malalignment is seen. Evidence of old postsurgical or posttraumatic deformity of the proximal femurs with suggestion of prior hardware bilaterally. Pubic symphysis appears intact. SI joints are patent. IMPRESSION: No acute osseous abnormality. 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 Electronically Signed   By: Jasmine Pang M.D.   On: 01/09/2017 03:30    Procedures Procedures (including critical care time)   Final Clinical Impressions(s) / ED Diagnoses   Final diagnoses:  Other chronic pain  Already on meloxicam seen 2 times in the past 2 days  Strict return precautions given for  Shortness of breath, swelling or the lips or tongue, chest pain, dyspnea on exertion, new weakness or numbness changes in vision or speech,  Inability to tolerate liquids or food, changes in voice cough, altered mental status or any concerns. No signs of systemic illness or infection. The patient is nontoxic-appearing on exam and vital signs are within normal limits.    I have reviewed the triage vital signs and the nursing notes. Pertinent labs &imaging results that were available during my care of the patient were reviewed by me and considered in my medical decision making (see chart for details).  After history, exam, and medical workup I feel the patient has been appropriately medically screened and is safe for discharge home. Pertinent diagnoses were discussed with the patient. Patient was given return precautions.    Yuma Blucher, MD 01/10/17 9604

## 2017-01-10 NOTE — ED Notes (Signed)
Bed: WA19 Expected date:  Expected time:  Means of arrival:  Comments: 

## 2017-01-11 ENCOUNTER — Encounter (HOSPITAL_COMMUNITY): Payer: Self-pay | Admitting: Emergency Medicine

## 2017-01-11 ENCOUNTER — Emergency Department (HOSPITAL_COMMUNITY)
Admission: EM | Admit: 2017-01-11 | Discharge: 2017-01-11 | Disposition: A | Discharge status: Home / Self Care | Payer: Self-pay | Attending: Emergency Medicine | Admitting: Emergency Medicine

## 2017-01-11 DIAGNOSIS — Z5321 Procedure and treatment not carried out due to patient leaving prior to being seen by health care provider: Secondary | ICD-10-CM | POA: Insufficient documentation

## 2017-01-11 NOTE — ED Notes (Addendum)
Attempted to call pt from waiting room. Pt did not answer. Called x3

## 2017-01-11 NOTE — ED Triage Notes (Signed)
Patient here for dental pain.  He states that he has swelling where his wisdom teeth are coming in and that is what is bothering him.  He did not take any meds before coming to ED.

## 2017-01-11 NOTE — ED Notes (Signed)
Pt called 3X, no response 

## 2017-01-12 NOTE — ED Provider Notes (Signed)
Did not evaluate patient   Albesa Seen, NP 01/12/17 2018    Azalia Bilis, MD 01/13/17 0100

## 2017-01-23 DIAGNOSIS — Z79899 Other long term (current) drug therapy: Secondary | ICD-10-CM | POA: Insufficient documentation

## 2017-01-23 DIAGNOSIS — F17210 Nicotine dependence, cigarettes, uncomplicated: Secondary | ICD-10-CM | POA: Insufficient documentation

## 2017-01-23 DIAGNOSIS — J45909 Unspecified asthma, uncomplicated: Secondary | ICD-10-CM | POA: Insufficient documentation

## 2017-01-23 DIAGNOSIS — R079 Chest pain, unspecified: Secondary | ICD-10-CM | POA: Insufficient documentation

## 2017-01-24 ENCOUNTER — Emergency Department (HOSPITAL_COMMUNITY)
Admission: EM | Admit: 2017-01-24 | Discharge: 2017-01-24 | Disposition: A | Discharge status: Home / Self Care | Payer: Self-pay | Attending: Emergency Medicine | Admitting: Emergency Medicine

## 2017-01-24 ENCOUNTER — Encounter (HOSPITAL_COMMUNITY): Payer: Self-pay | Admitting: Emergency Medicine

## 2017-01-24 ENCOUNTER — Emergency Department (HOSPITAL_COMMUNITY): Payer: Self-pay

## 2017-01-24 DIAGNOSIS — R079 Chest pain, unspecified: Secondary | ICD-10-CM

## 2017-01-24 LAB — CBC
HCT: 38.9 % — ABNORMAL LOW (ref 39.0–52.0)
HEMOGLOBIN: 12.7 g/dL — AB (ref 13.0–17.0)
MCH: 27.9 pg (ref 26.0–34.0)
MCHC: 32.6 g/dL (ref 30.0–36.0)
MCV: 85.5 fL (ref 78.0–100.0)
PLATELETS: 216 10*3/uL (ref 150–400)
RBC: 4.55 MIL/uL (ref 4.22–5.81)
RDW: 12.9 % (ref 11.5–15.5)
WBC: 5.6 10*3/uL (ref 4.0–10.5)

## 2017-01-24 LAB — BASIC METABOLIC PANEL
ANION GAP: 5 (ref 5–15)
BUN: 7 mg/dL (ref 6–20)
CALCIUM: 8.9 mg/dL (ref 8.9–10.3)
CO2: 26 mmol/L (ref 22–32)
CREATININE: 1.04 mg/dL (ref 0.61–1.24)
Chloride: 108 mmol/L (ref 101–111)
GFR calc non Af Amer: >60 mL/min (ref >60)
Glucose, Bld: 101 mg/dL — ABNORMAL HIGH (ref 65–99)
Potassium: 3.8 mmol/L (ref 3.5–5.1)
SODIUM: 139 mmol/L (ref 135–145)

## 2017-01-24 LAB — I-STAT TROPONIN, ED: TROPONIN I, POC: 0 ng/mL (ref 0.00–0.08)

## 2017-01-24 MED ORDER — CYCLOBENZAPRINE HCL 10 MG PO TABS: 10.0000 mg | ORAL_TABLET | Freq: Two times a day (BID) | ORAL | 0 refills | Status: AC | PRN

## 2017-01-24 NOTE — ED Triage Notes (Signed)
Pt c/o non radiating central chest pain x 3 months. Denies shortness of breath, n/v, diaphoresis.

## 2017-01-24 NOTE — ED Notes (Signed)
PT states understanding of care given, follow up care, and medication prescribed. PT ambulated from ED to car with a steady gait. 

## 2017-01-24 NOTE — Discharge Instructions (Signed)
You can take Tylenol or Ibuprofen as directed for pain. You can alternate Tylenol and Ibuprofen every 4 hours. If you take Tylenol at 1pm, then you can take Ibuprofen at 5pm. Then you can take Tylenol again at 9pm.   Take Flexeril as prescribed. This medication will make you drowsy so do not drive or drink alcohol when taking it.  Follow-up referred clinic for primary care evaluation. He didn't call and arrange an appointment next few days.  Return to emergency department for worsening chest pain, difficulty breathing, fever, numbness of your arms or legs, difficulty walking, swelling in her legs or any other worsening or concerning symptoms.

## 2017-01-24 NOTE — ED Provider Notes (Signed)
[facility]-EMERGENCY DEPT Provider Note   CSN: [id number] Arrival date & time: 01/23/17  2352     History   Chief Complaint Chief Complaint  Patient presents with  . Chest Pain    HPI Stephen Golden is a 26 y.o. male presents with chest pain that has been ongoing for the last 3 years. Patient reports that it comes and goes but states that for the last 3 days, which is constant. He states that it is in the lower midsternal region. He describes the pain as a "ache." Patient states that the pain is not worse with deep inspiration, exertion, sitting forward or lying down. Patient states that the chest pain is not changed with any movement. Patient does report that he weight lifts and feels like the chest pain gets worse after lifting weights. Patient does report that he has a small knot to the midsternal region and he feels like is pressing up and causing a lot of pain. He states it has not been there for about a 3 years. He has not had it evaluated. Patient states that he is not getting nauseous or diaphoretic with pain. Patient states that he smokes cigarettes and states he smokes [place] one pack per week. He does report marijuana use and she saw some use was earlier today. He denies any cocaine, heroine use. Patient denies any fevers, chills, difficulty breathing, cough, congestion, abdominal pain, nausea/vomiting, leg swelling, numbness in his arms or legs.  The history is provided by the patient.    Past Medical History:  Diagnosis Date  . Asthma     There are no active problems to display for this patient.   Past Surgical History:  Procedure Laterality Date  . LEG SURGERY Bilateral    26 years old       Home Medications    Prior to Admission medications   Medication Sig Start Date End Date Taking? Authorizing Provider  acyclovir (ZOVIRAX) 400 MG tablet Take 1 tablet (400 mg total) by mouth 3 (three) times daily. Patient not taking: Reported on 08/07/2016 07/21/16   Renne Crigler, PA-C  benzonatate (TESSALON) 100 MG capsule Take 1 capsule (100 mg total) by mouth every 8 (eight) hours. Patient not taking: Reported on 08/07/2016 05/26/16   Linwood Dibbles, MD  cyclobenzaprine (FLEXERIL) 10 MG tablet Take 1 tablet (10 mg total) by mouth 2 (two) times daily as needed for muscle spasms. 01/24/17   Maxwell Caul, PA-C  diclofenac sodium (VOLTAREN) 1 % GEL Apply 2 g topically 4 (four) times daily as needed (for pain). Patient not taking: Reported on 08/07/2016 05/10/16   Anselm Pancoast, PA-C  ibuprofen (ADVIL,MOTRIN) 600 MG tablet Take 1 tablet (600 mg total) by mouth every 8 (eight) hours as needed for mild pain. Patient not taking: Reported on 01/24/2017 08/07/16   Jacalyn Lefevre, MD  meloxicam (MOBIC) 7.5 MG tablet Take 2 tablets (15 mg total) by mouth daily. Patient not taking: Reported on 01/24/2017 01/09/17   Garlon Hatchet, PA-C  naproxen (NAPROSYN) 500 MG tablet Take 1 tablet (500 mg total) by mouth 2 (two) times daily. Patient not taking: Reported on 08/07/2016 05/26/16   Linwood Dibbles, MD    Family History No family history on file.  Social History Social History  Substance Use Topics  . Smoking status: Current Every Day Smoker  . Smokeless tobacco: Never Used  . Alcohol use No     Allergies   Patient has no known allergies.   Review  of Systems Review of Systems  Constitutional: Negative for chills and fever.  Respiratory: Negative for cough and shortness of breath.   Cardiovascular: Positive for chest pain. Negative for leg swelling.  Gastrointestinal: Negative for abdominal pain, diarrhea, nausea and vomiting.  Musculoskeletal: Negative for back pain and neck pain.  Neurological: Negative for dizziness, weakness, numbness and headaches.    Physical Exam Updated Vital Signs BP (!) 114/57   Pulse 78   Temp 98.6 F (37 C) (Oral)   Resp (!) 24   Ht  (1.778 m)   Wt 84.4 kg (186 lb)   SpO2 98%   BMI 26.69 kg/m   Physical Exam  Constitutional:  He is oriented to person, place, and time. He appears well-developed and well-nourished.  Sitting comfortably on examination table  HENT:  Head: Normocephalic and atraumatic.  Mouth/Throat: Oropharynx is clear and moist and mucous membranes are normal.  Eyes: Pupils are equal, round, and reactive to light. Conjunctivae, EOM and lids are normal.  Neck: Full passive range of motion without pain.  Cardiovascular: Normal rate, regular rhythm, normal heart sounds and normal pulses.  Exam reveals no gallop and no friction rub.   No murmur heard. Pulses:      Radial pulses are 2+ on the right side, and 2+ on the left side.       Dorsalis pedis pulses are 2+ on the right side, and 2+ on the left side.  Pulmonary/Chest: Effort normal and breath sounds normal.  No evidence of respiratory distress. Able to speak in full sentences without difficulty. Patient has a bony prominence of the subxiphoid process is mild tenderness palpation. No deformity or crepitus noted. No overlying warmth, swelling, redness.  Abdominal: Soft. Normal appearance. There is no tenderness. There is no rigidity and no guarding.  Musculoskeletal: Normal range of motion.  Neurological: He is alert and oriented to person, place, and time.  Skin: Skin is warm and dry. Capillary refill takes less than 2 seconds.  Psychiatric: He has a normal mood and affect. His speech is normal.  Nursing note and vitals reviewed.    ED Treatments / Results  Labs (all labs ordered are listed, but only abnormal results are displayed) Labs Reviewed  BASIC METABOLIC PANEL - Abnormal; Notable for the following:       Result Value   Glucose, Bld 101 (*)    All other components within normal limits  CBC - Abnormal; Notable for the following:    Hemoglobin 12.7 (*)    HCT 38.9 (*)    All other components within normal limits  I-STAT TROPONIN, ED    EKG  EKG Interpretation  Date/Time:  Saturday January 23 2017 23:55:43 EDT Ventricular Rate:    60 PR Interval:  160 QRS Duration: 98 QT Interval:  378 QTC Calculation: 378 R Axis:   81 Text Interpretation:  Normal sinus rhythm Normal ECG No significant change was found Confirmed by Azalia Bilis ([id number]) on 01/24/2017 1:40:29 AM       Radiology Dg Chest 2 View  Result Date: 01/24/2017 CLINICAL DATA:  Chest pain EXAM: CHEST  2 VIEW COMPARISON:  Chest radiograph 08/06/2016 FINDINGS: The heart size and mediastinal contours are within normal limits. Both lungs are clear. The visualized skeletal structures are unremarkable. IMPRESSION: No active cardiopulmonary disease. Electronically Signed   By: Deatra Robinson M.D.   On: 01/24/2017 00:22    Procedures Procedures (including critical care time)  Medications Ordered in ED Medications - No data to  display   Initial Impression / Assessment and Plan / ED Course  I have reviewed the triage vital signs and the nursing notes.  Pertinent labs & imaging results that were available during my care of the patient were reviewed by me and considered in my medical decision making (see chart for details).     26 year old male who presents with chest pain that has been ongoing for 3 years, constant for the last 3 days. No associated shortness of breath, nausea/vomiting. Patient is afebrile, non-toxic appearing, sitting comfortably on examination table. Vital signs reviewed and stable. Physical exam shows a bony prominence of the xiphoid process but otherwise normal physical exam. Low suspicion for ACS and allergy given history/physical exam. Concern for musculoskeletal etiology. Initial labs ordered at triage, including BMP, Troponin, CBC, EKG, CXR.  Labs imaging review. A troponin is negative. BMP is unremarkable. CBC is unremarkable. Chest x-rays for any acute infectious etiology. No bony abnormalities noted on chest x-ray. EKG shows a rhythm, rate 60. There is some early repolarization but this appears consistent with previous EKGs. Suspect that  pain is likely due to muscular skeletal radiology. We'll plan to treat with a short course of muscle relaxers. Encouraged continued NSAID use for pain relief. Provided patient with a list of clinic resources to use if he does not have a PCP. Instructed to call them today to arrange follow-up in the next 24-48 hours. Strict return precautions discussed. Patient expresses understanding and agreement to plan.    Final Clinical Impressions(s) / ED Diagnoses   Final diagnoses:  Chest pain, unspecified type    New Prescriptions Discharge Medication List as of 01/24/2017  2:14 AM    START taking these medications   Details  cyclobenzaprine (FLEXERIL) 10 MG tablet Take 1 tablet (10 mg total) by mouth 2 (two) times daily as needed for muscle spasms., Starting Sun 01/24/2017, Print         Maxwell Caul, PA-C 01/24/17 1610    Azalia Bilis, MD 01/24/17 [phone]

## 2017-01-25 ENCOUNTER — Encounter (HOSPITAL_COMMUNITY): Payer: Self-pay | Admitting: Emergency Medicine

## 2017-01-25 ENCOUNTER — Emergency Department (HOSPITAL_COMMUNITY): Payer: Self-pay

## 2017-01-25 DIAGNOSIS — W51XXXA Accidental striking against or bumped into by another person, initial encounter: Secondary | ICD-10-CM | POA: Insufficient documentation

## 2017-01-25 DIAGNOSIS — F17200 Nicotine dependence, unspecified, uncomplicated: Secondary | ICD-10-CM | POA: Insufficient documentation

## 2017-01-25 DIAGNOSIS — S60221A Contusion of right hand, initial encounter: Secondary | ICD-10-CM | POA: Insufficient documentation

## 2017-01-25 DIAGNOSIS — J45909 Unspecified asthma, uncomplicated: Secondary | ICD-10-CM | POA: Insufficient documentation

## 2017-01-25 DIAGNOSIS — Y929 Unspecified place or not applicable: Secondary | ICD-10-CM | POA: Insufficient documentation

## 2017-01-25 DIAGNOSIS — Y999 Unspecified external cause status: Secondary | ICD-10-CM | POA: Insufficient documentation

## 2017-01-25 DIAGNOSIS — Y939 Activity, unspecified: Secondary | ICD-10-CM | POA: Insufficient documentation
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
## 2017-01-26 ENCOUNTER — Emergency Department (HOSPITAL_COMMUNITY)
Admission: EM | Admit: 2017-01-26 | Discharge: 2017-01-26 | Disposition: A | Discharge status: Home / Self Care | Payer: Self-pay | Attending: Emergency Medicine | Admitting: Emergency Medicine

## 2017-01-26 DIAGNOSIS — S60221A Contusion of right hand, initial encounter: Secondary | ICD-10-CM

## 2017-01-26 DIAGNOSIS — W228XXD Striking against or struck by other objects, subsequent encounter: Secondary | ICD-10-CM | POA: Insufficient documentation

## 2017-01-26 DIAGNOSIS — S60221D Contusion of right hand, subsequent encounter: Secondary | ICD-10-CM | POA: Insufficient documentation

## 2017-01-26 DIAGNOSIS — J45909 Unspecified asthma, uncomplicated: Secondary | ICD-10-CM | POA: Insufficient documentation

## 2017-01-26 DIAGNOSIS — F17200 Nicotine dependence, unspecified, uncomplicated: Secondary | ICD-10-CM | POA: Insufficient documentation
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
## 2017-01-26 NOTE — Discharge Instructions (Signed)
Ice 20 min at a time. Take ibuprofen or tylenol as needed. Return for any worsening symptoms or follow up with Dr. Melvyn Novas

## 2017-01-27 ENCOUNTER — Emergency Department (HOSPITAL_COMMUNITY)
Admission: EM | Admit: 2017-01-27 | Discharge: 2017-01-27 | Disposition: A | Discharge status: Home / Self Care | Payer: Self-pay | Attending: Emergency Medicine | Admitting: Emergency Medicine

## 2017-01-27 ENCOUNTER — Emergency Department (HOSPITAL_COMMUNITY): Payer: Self-pay

## 2017-01-27 ENCOUNTER — Emergency Department (HOSPITAL_COMMUNITY)
Admission: EM | Admit: 2017-01-27 | Discharge: 2017-01-28 | Disposition: A | Discharge status: Home / Self Care | Payer: Self-pay | Attending: Emergency Medicine | Admitting: Emergency Medicine

## 2017-01-27 ENCOUNTER — Encounter (HOSPITAL_COMMUNITY): Payer: Self-pay | Admitting: Emergency Medicine

## 2017-01-27 DIAGNOSIS — Z79899 Other long term (current) drug therapy: Secondary | ICD-10-CM | POA: Insufficient documentation

## 2017-01-27 DIAGNOSIS — Y9389 Activity, other specified: Secondary | ICD-10-CM | POA: Insufficient documentation

## 2017-01-27 DIAGNOSIS — Y999 Unspecified external cause status: Secondary | ICD-10-CM | POA: Insufficient documentation

## 2017-01-27 DIAGNOSIS — J45909 Unspecified asthma, uncomplicated: Secondary | ICD-10-CM | POA: Insufficient documentation

## 2017-01-27 DIAGNOSIS — Y929 Unspecified place or not applicable: Secondary | ICD-10-CM | POA: Insufficient documentation

## 2017-01-27 DIAGNOSIS — X58XXXA Exposure to other specified factors, initial encounter: Secondary | ICD-10-CM | POA: Insufficient documentation

## 2017-01-27 DIAGNOSIS — S60221A Contusion of right hand, initial encounter: Secondary | ICD-10-CM | POA: Insufficient documentation

## 2017-01-27 DIAGNOSIS — F17210 Nicotine dependence, cigarettes, uncomplicated: Secondary | ICD-10-CM | POA: Insufficient documentation

## 2017-01-27 DIAGNOSIS — S60221D Contusion of right hand, subsequent encounter: Secondary | ICD-10-CM

## 2017-01-27 MED ORDER — IBUPROFEN 400 MG PO TABS
ORAL_TABLET | ORAL | Status: AC
  Filled 2017-01-27: qty 1
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
## 2017-01-27 NOTE — Discharge Instructions (Signed)
You may alternate Tylenol 1000 mg every 6 hours as needed for pain and Ibuprofen 800 mg every 8 hours as needed for pain.  Please take Ibuprofen with food. ° ° ° °To find a primary care or specialty doctor please call [phone] or [phone] to access "[facility] Find a Doctor Service." ° °You may also go on the [facility] website at [email or web address] ° °There are also multiple [facility], [facility], [facility] and [facility] practices throughout the [place] that are frequently accepting new patients. You may find a clinic that is close to your home and contact them. ° °[facility] -  °[address] °[place] [address] °[phone] ° ° °[facility] -  °[address] °[place] [address] [phone] ° ° °[facility] - °[address]  °[place] [address] °[phone] ° ° °

## 2017-01-27 NOTE — ED Notes (Signed)
Bed: [facility] Expected date:  Expected time:  Means of arrival:  Comments: 

## 2017-01-28 MED ORDER — IBUPROFEN 800 MG PO TABS: 800.0000 mg | ORAL_TABLET | Freq: Three times a day (TID) | ORAL | 0 refills | Status: AC
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
## 2017-01-28 NOTE — ED Notes (Signed)
Pt unable to sign d/c due to signature pad not working.

## 2017-02-08 ENCOUNTER — Encounter (HOSPITAL_COMMUNITY): Payer: Self-pay | Admitting: Emergency Medicine

## 2017-02-08 ENCOUNTER — Emergency Department (HOSPITAL_COMMUNITY)
Admission: EM | Admit: 2017-02-08 | Discharge: 2017-02-09 | Disposition: A | Discharge status: Home / Self Care | Payer: Self-pay | Attending: Emergency Medicine | Admitting: Emergency Medicine

## 2017-02-08 DIAGNOSIS — F17200 Nicotine dependence, unspecified, uncomplicated: Secondary | ICD-10-CM | POA: Insufficient documentation

## 2017-02-08 DIAGNOSIS — Y939 Activity, unspecified: Secondary | ICD-10-CM | POA: Insufficient documentation

## 2017-02-08 DIAGNOSIS — Y929 Unspecified place or not applicable: Secondary | ICD-10-CM | POA: Insufficient documentation

## 2017-02-08 DIAGNOSIS — J45909 Unspecified asthma, uncomplicated: Secondary | ICD-10-CM | POA: Insufficient documentation

## 2017-02-08 DIAGNOSIS — Y33XXXA Other specified events, undetermined intent, initial encounter: Secondary | ICD-10-CM | POA: Insufficient documentation

## 2017-02-08 DIAGNOSIS — Y998 Other external cause status: Secondary | ICD-10-CM | POA: Insufficient documentation

## 2017-02-08 DIAGNOSIS — S60221A Contusion of right hand, initial encounter: Secondary | ICD-10-CM | POA: Insufficient documentation
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
## 2017-02-08 NOTE — ED Notes (Signed)
Bed: WTR9 Expected date:  Expected time:  Means of arrival:  Comments: 
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
## 2017-02-09 NOTE — ED Notes (Signed)
Secretary Cabin crewcalling ortho tech.

## 2017-06-25 IMAGING — CR DG CHEST 2V
2 series · 2 of 2 positions shown · non-contrast
Comparison: 12/20/2014

CLINICAL DATA: Mid chest pain tonight.

EXAM:
CHEST  2 VIEW

[chest pa]
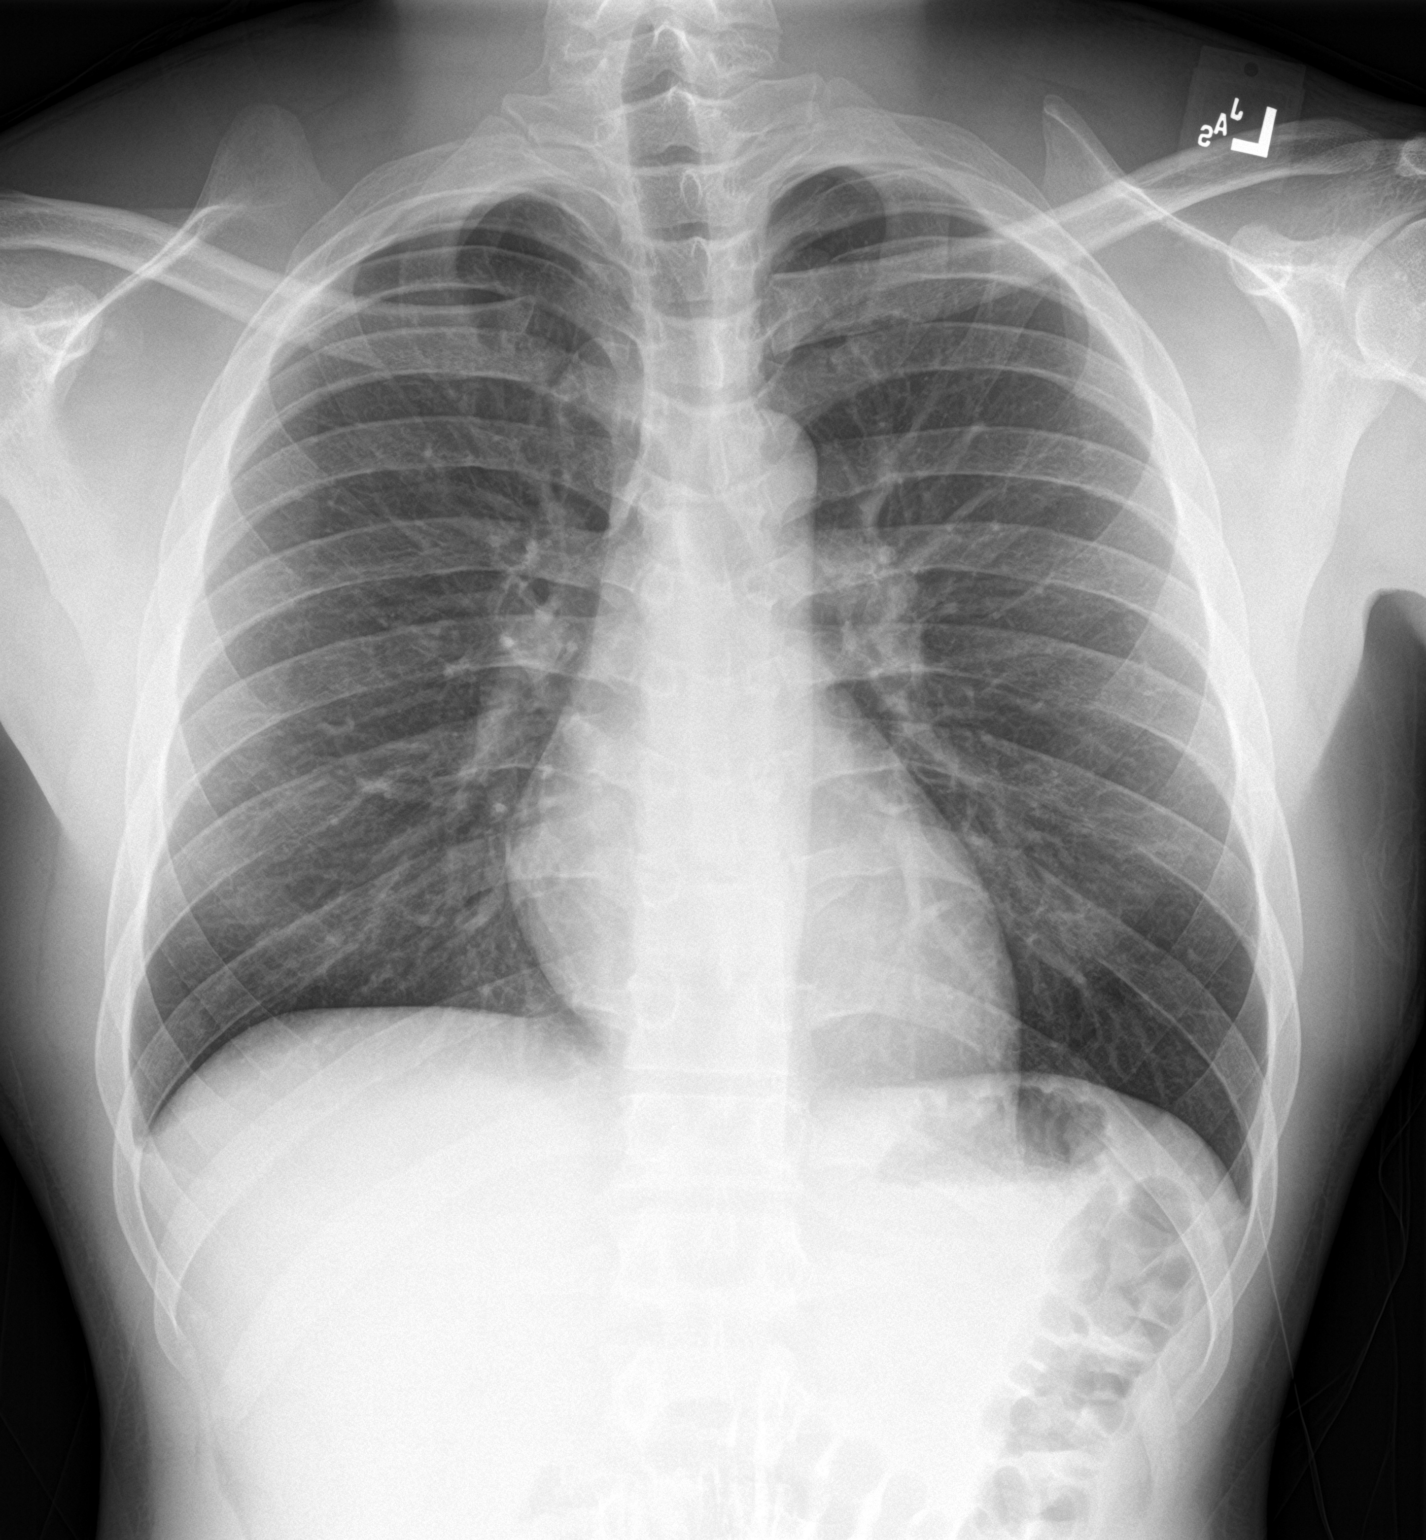

[chest lat]
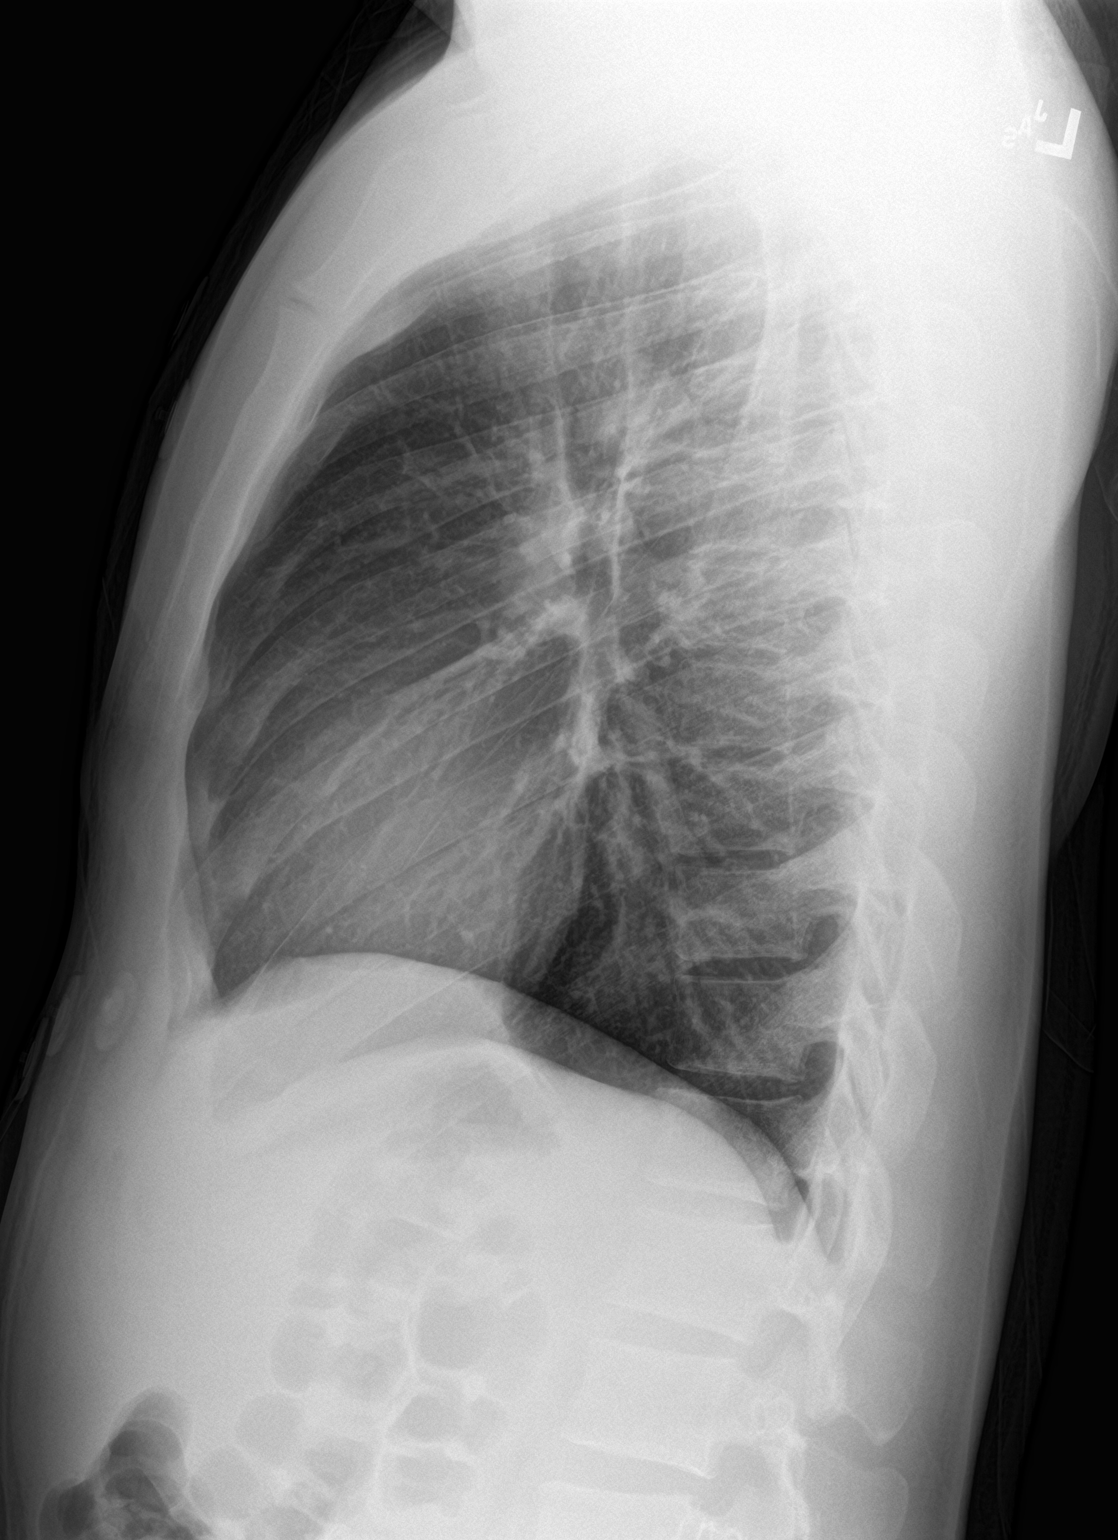

[2 of 2 positions shown; findings below may reference images not displayed]

FINDINGS: The cardiomediastinal contours are normal. The lungs are clear.
Pulmonary vasculature is normal. No consolidation, pleural effusion,
or pneumothorax. No acute osseous abnormalities are seen.
IMPRESSION: No active cardiopulmonary disease.

## 2019-03-05 ENCOUNTER — Emergency Department (HOSPITAL_COMMUNITY)
Admission: EM | Admit: 2019-03-05 | Discharge: 2019-03-05 | Disposition: A | Discharge status: Home / Self Care | Payer: Self-pay | Attending: Emergency Medicine | Admitting: Emergency Medicine

## 2019-03-05 ENCOUNTER — Other Ambulatory Visit: Payer: Self-pay

## 2019-03-05 ENCOUNTER — Encounter (HOSPITAL_COMMUNITY): Payer: Self-pay

## 2019-03-05 DIAGNOSIS — R109 Unspecified abdominal pain: Secondary | ICD-10-CM | POA: Insufficient documentation

## 2019-03-05 DIAGNOSIS — Z5321 Procedure and treatment not carried out due to patient leaving prior to being seen by health care provider: Secondary | ICD-10-CM | POA: Insufficient documentation

## 2019-03-05 NOTE — ED Provider Notes (Signed)
28 year old male with report of flank pain, upon entering the room patient is on the phone and states he needs a few minutes and does not want to be seen right now.  Later informed by patient's nurse that patient has walked out.   Tacy Learn, PA-C 03/05/19 0114    Orpah Greek, MD 03/05/19 [phone]

## 2019-03-05 NOTE — ED Notes (Signed)
This Probation officer entered pt room to again inform pt that urine sample was needed, pt asked how long "this process" was going to take, then informed me that he had a family situation and needed to leave. Pt was last seen walking out of department independently with steady gait and showing no obvious signs of distress.

## 2019-03-05 NOTE — ED Triage Notes (Signed)
Patient arrives via [facility] with complaints of intermittent left flank and abdominal pain. Denies any urinary symptoms.

## 2019-12-30 ENCOUNTER — Emergency Department (HOSPITAL_COMMUNITY)
Admission: EM | Admit: 2019-12-30 | Discharge: 2019-12-31 | Disposition: A | Discharge status: Home / Self Care | Payer: Medicaid Other | Attending: Emergency Medicine | Admitting: Emergency Medicine

## 2019-12-30 ENCOUNTER — Encounter (HOSPITAL_COMMUNITY): Payer: Self-pay

## 2019-12-30 DIAGNOSIS — Z5321 Procedure and treatment not carried out due to patient leaving prior to being seen by health care provider: Secondary | ICD-10-CM | POA: Insufficient documentation

## 2019-12-30 DIAGNOSIS — M25521 Pain in right elbow: Secondary | ICD-10-CM | POA: Insufficient documentation

## 2019-12-30 DIAGNOSIS — M7989 Other specified soft tissue disorders: Secondary | ICD-10-CM | POA: Insufficient documentation

## 2019-12-30 NOTE — ED Triage Notes (Signed)
Pt reports pain to the R elbow for the past 3 days, denies injury

## 2019-12-31 NOTE — ED Notes (Signed)
Pt not responding to calls for scans or vital recheck. Checked outside as well.

## 2021-09-20 ENCOUNTER — Emergency Department
Admission: EM | Admit: 2021-09-20 | Discharge: 2021-09-20 | Disposition: A | Discharge status: Home / Self Care | Payer: No Typology Code available for payment source | Attending: Emergency Medicine | Admitting: Emergency Medicine

## 2021-09-20 ENCOUNTER — Encounter: Payer: Self-pay | Admitting: Medical Oncology

## 2021-09-20 DIAGNOSIS — R519 Headache, unspecified: Secondary | ICD-10-CM | POA: Diagnosis present

## 2021-09-20 DIAGNOSIS — J45909 Unspecified asthma, uncomplicated: Secondary | ICD-10-CM | POA: Insufficient documentation

## 2021-09-20 DIAGNOSIS — Y92410 Unspecified street and highway as the place of occurrence of the external cause: Secondary | ICD-10-CM | POA: Diagnosis not present
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
## 2021-09-20 NOTE — Discharge Instructions (Signed)
You can take Tylenol and Motrin for any aches and pains.  Rest and avoid technology if that is making the head pain worse. 
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
## 2022-07-15 DIAGNOSIS — Z23 Encounter for immunization: Secondary | ICD-10-CM | POA: Diagnosis not present

## 2022-07-15 DIAGNOSIS — Z1211 Encounter for screening for malignant neoplasm of colon: Secondary | ICD-10-CM | POA: Diagnosis not present

## 2022-07-15 DIAGNOSIS — I10 Essential (primary) hypertension: Secondary | ICD-10-CM | POA: Diagnosis not present

## 2022-07-15 DIAGNOSIS — Z131 Encounter for screening for diabetes mellitus: Secondary | ICD-10-CM | POA: Diagnosis not present

## 2022-07-15 DIAGNOSIS — D3502 Benign neoplasm of left adrenal gland: Secondary | ICD-10-CM | POA: Diagnosis not present

## 2022-09-23 DIAGNOSIS — G44221 Chronic tension-type headache, intractable: Secondary | ICD-10-CM | POA: Diagnosis not present

## 2022-10-16 DIAGNOSIS — Z1329 Encounter for screening for other suspected endocrine disorder: Secondary | ICD-10-CM | POA: Diagnosis not present

## 2022-10-16 DIAGNOSIS — Z0000 Encounter for general adult medical examination without abnormal findings: Secondary | ICD-10-CM | POA: Diagnosis not present

## 2022-10-16 DIAGNOSIS — Z125 Encounter for screening for malignant neoplasm of prostate: Secondary | ICD-10-CM | POA: Diagnosis not present

## 2023-02-15 DIAGNOSIS — Z23 Encounter for immunization: Secondary | ICD-10-CM | POA: Diagnosis not present

## 2023-02-15 DIAGNOSIS — G44229 Chronic tension-type headache, not intractable: Secondary | ICD-10-CM | POA: Diagnosis not present

## 2023-02-15 DIAGNOSIS — I10 Essential (primary) hypertension: Secondary | ICD-10-CM | POA: Diagnosis not present

## 2023-04-12 DIAGNOSIS — R001 Bradycardia, unspecified: Secondary | ICD-10-CM | POA: Diagnosis not present

## 2023-04-12 DIAGNOSIS — I10 Essential (primary) hypertension: Secondary | ICD-10-CM | POA: Diagnosis not present

## 2023-04-12 DIAGNOSIS — R002 Palpitations: Secondary | ICD-10-CM | POA: Diagnosis not present

## 2023-10-23 ENCOUNTER — Encounter (HOSPITAL_COMMUNITY): Payer: Self-pay | Admitting: *Deleted

## 2023-10-23 ENCOUNTER — Other Ambulatory Visit: Payer: Self-pay

## 2023-10-23 ENCOUNTER — Emergency Department (HOSPITAL_COMMUNITY)
Admission: EM | Admit: 2023-10-23 | Discharge: 2023-10-23 | Disposition: A | Discharge status: Home / Self Care | Attending: Emergency Medicine | Admitting: Emergency Medicine

## 2023-10-23 DIAGNOSIS — Z202 Contact with and (suspected) exposure to infections with a predominantly sexual mode of transmission: Secondary | ICD-10-CM | POA: Diagnosis not present

## 2023-10-23 LAB — URINALYSIS, ROUTINE W REFLEX MICROSCOPIC
Bacteria, UA: NONE SEEN
Bilirubin Urine: NEGATIVE
Glucose, UA: NEGATIVE mg/dL
Hgb urine dipstick: NEGATIVE
Ketones, ur: NEGATIVE mg/dL
Nitrite: NEGATIVE
Protein, ur: NEGATIVE mg/dL
Specific Gravity, Urine: 1.018 (ref 1.005–1.030)
pH: 6 (ref 5.0–8.0)

## 2023-10-23 LAB — HIV ANTIBODY (ROUTINE TESTING W REFLEX): HIV Screen 4th Generation wRfx: NONREACTIVE

## 2023-10-23 MED ORDER — DOXYCYCLINE HYCLATE 100 MG PO TABS
100.0000 mg | ORAL_TABLET | Freq: Once | ORAL | Status: AC
Start: 2023-10-23 — End: 2023-10-23
  Administered 2023-10-23: 100 mg via ORAL
  Filled 2023-10-23: qty 1

## 2023-10-23 MED ORDER — DOXYCYCLINE HYCLATE 100 MG PO CAPS: 100.0000 mg | ORAL_CAPSULE | Freq: Two times a day (BID) | ORAL | 0 refills | Status: AC

## 2023-10-23 MED ORDER — CEFTRIAXONE SODIUM 500 MG IJ SOLR
500.0000 mg | Freq: Once | INTRAMUSCULAR | Status: AC
  Administered 2023-10-23: 500 mg via INTRAMUSCULAR
  Filled 2023-10-23: qty 500
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
## 2023-10-23 NOTE — Discharge Instructions (Addendum)
 It was a pleasure taking care of you today.  Based on your history and physical exam I feel you are safe for discharge.  Please continue to monitor your symptoms at home and please return for repeat testing or care if you begin to develop abnormal discharge, painful urination, any type of rash or lesion in your genital area.  Today you were tested for gonorrhea, chlamydia, HIV, and syphilis.  The lab/pharmacy will notify you if these come back positive.  Today you were also given prophylactic antibiotic treatment for gonorrhea and chlamydia.  Please return to the emergency department or seek further medical care if experiencing the following symptoms including but not limited to fever, chills, painful urination, severe genital pain, genital lesion, rash, other concerning symptom.  Recommend follow-up with primary care and specialist as scheduled.  Please take prescribed antibiotic as instructed, please be aware that one of the side effects of this antibiotic is potential sunburn.

## 2023-10-23 NOTE — ED Triage Notes (Signed)
 The pt wants to be checked for a std and he has no symptoms he just wants to be checked

## 2023-10-24 LAB — RPR: RPR Ser Ql: NONREACTIVE

## 2023-10-25 LAB — GC/CHLAMYDIA PROBE AMP (~~LOC~~) NOT AT ARMC
Chlamydia: NEGATIVE
Comment: NEGATIVE
Comment: NORMAL
Neisseria Gonorrhea: NEGATIVE
